# Patient Record
Sex: Male | Born: 1970
Health system: Southern US, Community
[De-identification: ages and names within clinical notes are randomized; demographics above are authoritative.]

## PROBLEM LIST (undated history)

## (undated) DIAGNOSIS — T753XXA Motion sickness, initial encounter: Secondary | ICD-10-CM

## (undated) DIAGNOSIS — T7840XA Allergy, unspecified, initial encounter: Secondary | ICD-10-CM

## (undated) DIAGNOSIS — Z972 Presence of dental prosthetic device (complete) (partial): Secondary | ICD-10-CM

## (undated) DIAGNOSIS — K635 Polyp of colon: Secondary | ICD-10-CM

## (undated) DIAGNOSIS — K219 Gastro-esophageal reflux disease without esophagitis: Secondary | ICD-10-CM

## (undated) DIAGNOSIS — N200 Calculus of kidney: Secondary | ICD-10-CM

## (undated) DIAGNOSIS — I10 Essential (primary) hypertension: Secondary | ICD-10-CM

## (undated) DIAGNOSIS — M1990 Unspecified osteoarthritis, unspecified site: Secondary | ICD-10-CM

## (undated) HISTORY — DX: Gastro-esophageal reflux disease without esophagitis: K21.9

## (undated) HISTORY — DX: Polyp of colon: K63.5

## (undated) HISTORY — DX: Allergy, unspecified, initial encounter: T78.40XA

---
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
## 2011-12-28 ENCOUNTER — Ambulatory Visit: Payer: Self-pay | Admitting: Podiatry

## 2012-01-29 ENCOUNTER — Emergency Department: Payer: Self-pay | Admitting: Emergency Medicine

## 2012-01-29 LAB — URINALYSIS, COMPLETE
Bilirubin,UR: NEGATIVE
Leukocyte Esterase: NEGATIVE
Nitrite: NEGATIVE
Ph: 8 (ref 4.5–8.0)
Protein: NEGATIVE
Specific Gravity: 1.018 (ref 1.003–1.030)
Squamous Epithelial: NONE SEEN

## 2012-09-25 ENCOUNTER — Emergency Department: Payer: Self-pay | Admitting: Emergency Medicine

## 2015-05-11 ENCOUNTER — Other Ambulatory Visit: Payer: Self-pay | Admitting: Family Medicine

## 2015-05-11 DIAGNOSIS — M898X8 Other specified disorders of bone, other site: Secondary | ICD-10-CM

## 2015-05-13 ENCOUNTER — Ambulatory Visit
Admission: RE | Admit: 2015-05-13 | Discharge: 2015-05-13 | Disposition: A | Discharge status: Home / Self Care | Payer: Federal, State, Local not specified - PPO | Source: Ambulatory Visit | Attending: Family Medicine | Admitting: Family Medicine

## 2015-05-13 DIAGNOSIS — M899 Disorder of bone, unspecified: Secondary | ICD-10-CM | POA: Insufficient documentation

## 2015-05-13 DIAGNOSIS — M898X8 Other specified disorders of bone, other site: Secondary | ICD-10-CM

## 2015-09-20 DIAGNOSIS — K080 Exfoliation of teeth due to systemic causes: Secondary | ICD-10-CM | POA: Diagnosis not present

## 2015-10-06 DIAGNOSIS — S0085XA Superficial foreign body of other part of head, initial encounter: Secondary | ICD-10-CM | POA: Diagnosis not present

## 2015-11-15 DIAGNOSIS — K080 Exfoliation of teeth due to systemic causes: Secondary | ICD-10-CM | POA: Diagnosis not present

## 2015-11-21 ENCOUNTER — Emergency Department
Admission: EM | Admit: 2015-11-21 | Discharge: 2015-11-21 | Disposition: A | Discharge status: Home / Self Care | Payer: Federal, State, Local not specified - PPO | Attending: Emergency Medicine | Admitting: Emergency Medicine

## 2015-11-21 ENCOUNTER — Emergency Department: Payer: Federal, State, Local not specified - PPO

## 2015-11-21 DIAGNOSIS — R109 Unspecified abdominal pain: Secondary | ICD-10-CM | POA: Diagnosis present

## 2015-11-21 DIAGNOSIS — R110 Nausea: Secondary | ICD-10-CM | POA: Diagnosis not present

## 2015-11-21 DIAGNOSIS — N200 Calculus of kidney: Secondary | ICD-10-CM | POA: Diagnosis not present

## 2015-11-21 HISTORY — DX: Calculus of kidney: N20.0

## 2015-11-21 LAB — URINALYSIS COMPLETE WITH MICROSCOPIC (ARMC ONLY)
BACTERIA UA: NONE SEEN
Bilirubin Urine: NEGATIVE
GLUCOSE, UA: NEGATIVE mg/dL
Ketones, ur: NEGATIVE mg/dL
LEUKOCYTES UA: NEGATIVE
NITRITE: NEGATIVE
PH: 5 (ref 5.0–8.0)
PROTEIN: NEGATIVE mg/dL
SPECIFIC GRAVITY, URINE: 1.02 (ref 1.005–1.030)
Squamous Epithelial / LPF: NONE SEEN

## 2015-11-21 LAB — CBC WITH DIFFERENTIAL/PLATELET
BASOS ABS: 0 10*3/uL (ref 0–0.1)
BASOS PCT: 0 %
Eosinophils Absolute: 0.1 10*3/uL (ref 0–0.7)
Eosinophils Relative: 2 %
HEMATOCRIT: 46.1 % (ref 40.0–52.0)
HEMOGLOBIN: 16.1 g/dL (ref 13.0–18.0)
LYMPHS PCT: 46 %
Lymphs Abs: 2.2 10*3/uL (ref 1.0–3.6)
MCH: 31.3 pg (ref 26.0–34.0)
MCHC: 35 g/dL (ref 32.0–36.0)
MCV: 89.4 fL (ref 80.0–100.0)
MONOS PCT: 11 %
Monocytes Absolute: 0.5 10*3/uL (ref 0.2–1.0)
NEUTROS ABS: 1.9 10*3/uL (ref 1.4–6.5)
NEUTROS PCT: 41 %
Platelets: 160 10*3/uL (ref 150–440)
RBC: 5.16 MIL/uL (ref 4.40–5.90)
RDW: 12.5 % (ref 11.5–14.5)
WBC: 4.8 10*3/uL (ref 3.8–10.6)

## 2015-11-21 LAB — BASIC METABOLIC PANEL
ANION GAP: 7 (ref 5–15)
BUN: 17 mg/dL (ref 6–20)
CHLORIDE: 107 mmol/L (ref 101–111)
CO2: 22 mmol/L (ref 22–32)
Calcium: 9 mg/dL (ref 8.9–10.3)
Creatinine, Ser: 1.01 mg/dL (ref 0.61–1.24)
GFR calc non Af Amer: >60 mL/min (ref >60)
Glucose, Bld: 111 mg/dL — ABNORMAL HIGH (ref 65–99)
POTASSIUM: 3.3 mmol/L — AB (ref 3.5–5.1)
Sodium: 136 mmol/L (ref 135–145)

## 2015-11-21 MED ORDER — HYDROMORPHONE HCL 1 MG/ML IJ SOLN
1.0000 mg | Freq: Once | INTRAMUSCULAR | Status: AC
  Administered 2015-11-21: 1 mg via INTRAVENOUS

## 2015-11-21 MED ORDER — ONDANSETRON HCL 4 MG/2ML IJ SOLN
INTRAMUSCULAR | Status: AC
  Filled 2015-11-21: qty 2

## 2015-11-21 MED ORDER — ONDANSETRON HCL 4 MG/2ML IJ SOLN
4.0000 mg | Freq: Once | INTRAMUSCULAR | Status: AC
  Administered 2015-11-21: 4 mg via INTRAVENOUS

## 2015-11-21 MED ORDER — IBUPROFEN 800 MG PO TABS: 800.0000 mg | ORAL_TABLET | Freq: Three times a day (TID) | ORAL | 0 refills | Status: DC | PRN

## 2015-11-21 MED ORDER — TAMSULOSIN HCL 0.4 MG PO CAPS: 0.4000 mg | ORAL_CAPSULE | Freq: Every day | ORAL | 0 refills | Status: DC

## 2015-11-21 MED ORDER — KETOROLAC TROMETHAMINE 30 MG/ML IJ SOLN
30.0000 mg | Freq: Once | INTRAMUSCULAR | Status: AC
  Administered 2015-11-21: 30 mg via INTRAVENOUS

## 2015-11-21 MED ORDER — SODIUM CHLORIDE 0.9 % IV BOLUS (SEPSIS)
1000.0000 mL | Freq: Once | INTRAVENOUS | Status: AC
  Administered 2015-11-21: 1000 mL via INTRAVENOUS

## 2015-11-21 MED ORDER — MORPHINE SULFATE (PF) 4 MG/ML IV SOLN
4.0000 mg | Freq: Once | INTRAVENOUS | Status: AC
  Administered 2015-11-21: 4 mg via INTRAVENOUS

## 2015-11-21 MED ORDER — OXYCODONE-ACETAMINOPHEN 5-325 MG PO TABS: 1.0000 | ORAL_TABLET | ORAL | 0 refills | Status: DC | PRN

## 2015-11-21 MED ORDER — ONDANSETRON HCL 4 MG PO TABS: 4.0000 mg | ORAL_TABLET | Freq: Three times a day (TID) | ORAL | 0 refills | Status: DC | PRN

## 2015-11-21 MED ORDER — KETOROLAC TROMETHAMINE 30 MG/ML IJ SOLN
INTRAMUSCULAR | Status: AC
  Administered 2015-11-21: 30 mg via INTRAVENOUS
  Filled 2015-11-21: qty 1

## 2015-11-21 MED ORDER — MORPHINE SULFATE (PF) 4 MG/ML IV SOLN
INTRAVENOUS | Status: AC
  Administered 2015-11-21: 4 mg via INTRAVENOUS
  Filled 2015-11-21: qty 1

## 2015-11-21 MED ORDER — HYDROMORPHONE HCL 1 MG/ML IJ SOLN
INTRAMUSCULAR | Status: AC
  Administered 2015-11-21: 1 mg via INTRAVENOUS
  Filled 2015-11-21: qty 1

## 2015-11-21 MED ORDER — SODIUM CHLORIDE 0.9 % IV SOLN: INTRAVENOUS | Status: DC

## 2015-11-21 NOTE — ED Provider Notes (Signed)
[facility] Emergency Department Provider Note    ____________________________________________   I have reviewed the triage vital signs and the nursing notes.   HISTORY  Chief Complaint Flank Pain   History limited by: Not Limited   HPI Bryan Cortez is a 45 y.o. male with history of a kidney stone a few years ago who presents for right flank pain. The pain is severe. It started suddenly this morning. It has been persistent since then. It has been accompanied by nausea and some vomiting. Patient denies any fevers. Denies any dysuria. Denies any change in defecation. States it somewhat reminds him of the pain he said with a kidney stone in the past. He denies requiring lithotripsy in the past.     Past Medical History:  Diagnosis Date  . Kidney stone     There are no active problems to display for this patient.   Past Surgical History:  Procedure Laterality Date  . TESTICLE SURGERY Bilateral    undistended      Allergies Review of patient's allergies indicates no known allergies.  No family history on file.  Social History Social History  Substance Use Topics  . Smoking status: Never Smoker  . Smokeless tobacco: Never Used  . Alcohol use No    Review of Systems  Constitutional: Negative for fever. Cardiovascular: Negative for chest pain. Respiratory: Negative for shortness of breath. Gastrointestinal:Positive for right flank pain and nausea Genitourinary: Negative for dysuria. Musculoskeletal: Negative for back pain. Skin: Negative for rash. Neurological: Negative for headaches, focal weakness or numbness.  10-point ROS otherwise negative.  ____________________________________________   PHYSICAL EXAM:  VITAL SIGNS: ED Triage Vitals  Enc Vitals Group     BP 177/112     Pulse 62     Resp 17     Temp 97.7     Temp src      SpO2 94%   Constitutional: Alert and oriented. Appears uncomfortable Eyes: Conjunctivae are  normal. PERRL. Normal extraocular movements. ENT   Head: Normocephalic and atraumatic.   Nose: No congestion/rhinnorhea.   Mouth/Throat: Mucous membranes are moist.   Neck: No stridor. Hematological/Lymphatic/Immunilogical: No cervical lymphadenopathy. Cardiovascular: Normal rate, regular rhythm.  No murmurs, rubs, or gallops. Respiratory: Normal respiratory effort without tachypnea nor retractions. Breath sounds are clear and equal bilaterally. No wheezes/rales/rhonchi. Gastrointestinal: Soft and nontender. No distention. There is no CVA tenderness. Genitourinary: Deferred Musculoskeletal: Normal range of motion in all extremities. No joint effusions.  No lower extremity tenderness nor edema. Neurologic:  Normal speech and language. No gross focal neurologic deficits are appreciated.  Skin:  Skin is warm, dry and intact. No rash noted. Psychiatric: Mood and affect are normal. Speech and behavior are normal. Patient exhibits appropriate insight and judgment.  ____________________________________________    LABS (pertinent positives/negatives)  Labs Reviewed  BASIC METABOLIC PANEL - Abnormal; Notable for the following:       Result Value   Potassium 3.3 (*)    Glucose, Bld 111 (*)    All other components within normal limits  URINALYSIS COMPLETEWITH MICROSCOPIC ([facility] ONLY) - Abnormal; Notable for the following:    Color, Urine YELLOW (*)    APPearance CLEAR (*)    Hgb urine dipstick 3+ (*)    All other components within normal limits  CBC WITH DIFFERENTIAL/PLATELET     ____________________________________________   EKG  None  ____________________________________________    RADIOLOGY  US renal IMPRESSION: 1. 4.3 mm calculus right upper renal pole. 2. 1.7 cm  simple cyst left upper renal pole.  ____________________________________________   PROCEDURES  Procedures  ____________________________________________   INITIAL IMPRESSION / ASSESSMENT  AND PLAN / ED COURSE  Pertinent labs & imaging results that were available during my care of the patient were reviewed by me and considered in my medical decision making (see chart for details).  Patient presents to the emergency department today because of concerns for right flank pain. Patient does have a history of kidney stone. Clinical exam is consistent with kidney stone. Will check blood work, urine and US OF THE KIDNEYS.  Clinical Course   [name] without any signs of hydronephrosis. UA without WBCs. Blood work with leukocytosis or elevation of creatinine. Think likely patient suffering from kidney stone without complication of infection or obstruction. Will discharge with pain medication, nausea medication and flomax. ____________________________________________   FINAL CLINICAL IMPRESSION(S) / ED DIAGNOSES  Final diagnoses:  Kidney stone     Note: This dictation was prepared with Dragon dictation. Any transcriptional errors that result from this process are unintentional    Nance Pear, MD 11/21/15 1154

## 2015-11-21 NOTE — ED Triage Notes (Signed)
Pt c/o right flank pain since 645am with nausea, hx of kidney stones.

## 2015-11-21 NOTE — ED Notes (Signed)
AAOx3.  Skin warm and dry.  NAD 

## 2015-11-21 NOTE — Discharge Instructions (Signed)
Please seek medical attention for any high fevers, chest pain, shortness of breath, change in behavior, persistent vomiting, bloody stool or any other new or concerning symptoms.  

## 2015-11-24 ENCOUNTER — Ambulatory Visit
Admission: RE | Admit: 2015-11-24 | Discharge: 2015-11-24 | Disposition: A | Discharge status: Home / Self Care | Payer: Federal, State, Local not specified - PPO | Source: Ambulatory Visit | Attending: Urology | Admitting: Urology

## 2015-11-24 ENCOUNTER — Encounter: Payer: Self-pay | Admitting: Urology

## 2015-11-24 ENCOUNTER — Ambulatory Visit (INDEPENDENT_AMBULATORY_CARE_PROVIDER_SITE_OTHER): Payer: Federal, State, Local not specified - PPO | Admitting: Urology

## 2015-11-24 VITALS — BP 175/112 | HR 61 | Temp 98.1°F | Ht 72.0 in | Wt 238.8 lb

## 2015-11-24 DIAGNOSIS — N132 Hydronephrosis with renal and ureteral calculous obstruction: Secondary | ICD-10-CM | POA: Diagnosis not present

## 2015-11-24 DIAGNOSIS — N400 Enlarged prostate without lower urinary tract symptoms: Secondary | ICD-10-CM | POA: Diagnosis not present

## 2015-11-24 DIAGNOSIS — M4316 Spondylolisthesis, lumbar region: Secondary | ICD-10-CM | POA: Diagnosis not present

## 2015-11-24 DIAGNOSIS — K5730 Diverticulosis of large intestine without perforation or abscess without bleeding: Secondary | ICD-10-CM | POA: Insufficient documentation

## 2015-11-24 DIAGNOSIS — N200 Calculus of kidney: Secondary | ICD-10-CM | POA: Diagnosis not present

## 2015-11-24 DIAGNOSIS — J309 Allergic rhinitis, unspecified: Secondary | ICD-10-CM | POA: Insufficient documentation

## 2015-11-24 DIAGNOSIS — R51 Headache: Secondary | ICD-10-CM

## 2015-11-24 MED ORDER — KETOROLAC TROMETHAMINE 60 MG/2ML IM SOLN
30.0000 mg | Freq: Once | INTRAMUSCULAR | Status: AC
  Administered 2015-11-24: 30 mg via INTRAMUSCULAR

## 2015-11-24 NOTE — Progress Notes (Signed)
11/24/2015 3:36 PM   Bryan Cortez [birth year] [id number]  Referring provider: No referring provider defined for this encounter.  Chief Complaint  Patient presents with  . New Patient (Initial Visit)    nephrolithiasis    HPI: The patient is a 45 year old male who presents for follow-up of a 4 mm right upper pole nonobstructing renal calculus. He originally presented in the ED a few days ago with severe right flank pain.  He has continued to have flank pain since that time requiring Percocet. He also has nausea and vomiting. He is also been intermittently taking ibuprofen. He continues to have 8-9 out of 10 pain.   In review of the ultrasound though however there is no sign of hydronephrosis.   PMH: Past Medical History:  Diagnosis Date  . Kidney stone     Surgical History: Past Surgical History:  Procedure Laterality Date  . TESTICLE SURGERY Bilateral    undistended    Home Medications:    Medication List       Accurate as of 11/24/15  3:36 PM. Always use your most recent med list.          ibuprofen 800 MG tablet Commonly known as:  ADVIL,MOTRIN Take 1 tablet (800 mg total) by mouth every 8 (eight) hours as needed.   ondansetron 4 MG tablet Commonly known as:  ZOFRAN Take 1 tablet (4 mg total) by mouth every 8 (eight) hours as needed for nausea or vomiting.   oxyCODONE-acetaminophen 5-325 MG tablet Commonly known as:  ROXICET Take 1 tablet by mouth every 4 (four) hours as needed for severe pain.   tamsulosin 0.4 MG Caps capsule Commonly known as:  FLOMAX Take 1 capsule (0.4 mg total) by mouth daily.       Allergies: No Known Allergies  Family History: No family history on file.  Social History:  reports that he has never smoked. He has never used smokeless tobacco. He reports that he does not drink alcohol. His drug history is not on file.  ROS: UROLOGY Frequent Urination?: No Hard to postpone urination?: No Burning/pain with urination?:  No Get up at night to urinate?: No Leakage of urine?: No Urine stream starts and stops?: No Trouble starting stream?: No Do you have to strain to urinate?: No Blood in urine?: No Urinary tract infection?: No Sexually transmitted disease?: No Injury to kidneys or bladder?: No Painful intercourse?: No Weak stream?: No Erection problems?: No Penile pain?: No  Gastrointestinal Nausea?: Yes Vomiting?: No Indigestion/heartburn?: No Diarrhea?: No Constipation?: Yes  Constitutional Fever: No Night sweats?: No Weight loss?: No Fatigue?: No  Skin Skin rash/lesions?: No Itching?: No  Eyes Blurred vision?: No Double vision?: No  Ears/Nose/Throat Sore throat?: No Sinus problems?: No  Hematologic/Lymphatic Swollen glands?: No Easy bruising?: No  Cardiovascular Leg swelling?: No Chest pain?: No  Respiratory Cough?: No Shortness of breath?: No  Endocrine Excessive thirst?: No  Musculoskeletal Back pain?: Yes Joint pain?: No  Neurological Headaches?: No Dizziness?: No  Psychologic Depression?: No Anxiety?: No  Physical Exam: BP (!) 175/112 (BP Location: Left Arm, Patient Position: Sitting, Cuff Size: Large)   Pulse 61   Temp 98.1 F (36.7 C) (Oral)   Ht 6' (1.829 m)   Wt 238 lb 12.8 oz (108.3 kg)   BMI 32.39 kg/m   Constitutional:  Alert and oriented, No acute distress. HEENT: [place] AT, moist mucus membranes.  Trachea midline, no masses. Cardiovascular: No clubbing, cyanosis, or edema. Respiratory: Normal respiratory effort, no increased  work of breathing. GI: Abdomen is soft, nontender, nondistended, no abdominal masses GU: Right CVA tenderness Skin: No rashes, bruises or suspicious lesions. Lymph: No cervical or inguinal adenopathy. Neurologic: Grossly intact, no focal deficits, moving all 4 extremities. Psychiatric: Normal mood and affect.  Laboratory Data: Lab Results  Component Value Date   WBC 4.8 11/21/2015   HGB 16.1 11/21/2015   HCT  46.1 11/21/2015   MCV 89.4 11/21/2015   PLT 160 11/21/2015    Lab Results  Component Value Date   CREATININE 1.01 11/21/2015    No results found for: PSA  No results found for: TESTOSTERONE  No results found for: HGBA1C  Urinalysis    Component Value Date/Time   COLORURINE YELLOW (A) 11/21/2015 0748   APPEARANCEUR CLEAR (A) 11/21/2015 0748   APPEARANCEUR Cloudy 01/29/2012 0652   LABSPEC 1.020 11/21/2015 0748   LABSPEC 1.018 01/29/2012 0652   PHURINE 5.0 11/21/2015 0748   GLUCOSEU NEGATIVE 11/21/2015 0748   GLUCOSEU Negative 01/29/2012 0652   HGBUR 3+ (A) 11/21/2015 0748   BILIRUBINUR NEGATIVE 11/21/2015 0748   BILIRUBINUR Negative 01/29/2012 0652   KETONESUR NEGATIVE 11/21/2015 0748   PROTEINUR NEGATIVE 11/21/2015 0748   NITRITE NEGATIVE 11/21/2015 0748   LEUKOCYTESUR NEGATIVE 11/21/2015 0748   LEUKOCYTESUR Negative 01/29/2012 0652    Pertinent Imaging: CLINICAL DATA:  Right flank pain. EXAM: RENAL / URINARY TRACT ULTRASOUND COMPLETE COMPARISON:  CT 01/29/2012. FINDINGS: Right Kidney: Length: 12.6 cm. Echogenicity within normal limits. No mass or hydronephrosis visualized. 4.3 mm calculus right upper renal pole. Left Kidney: Length: 11.8 cm. Echogenicity within normal limits. No mass or hydronephrosis visualized. 1.7 x 1.6 x 1.3 cm cyst. Bladder: Appears normal for degree of bladder distention. IMPRESSION: 1.  4.3 mm calculus right upper renal pole. 2.  1.7 cm simple cyst left upper renal pole.  Assessment & Plan:   1. Right nonobstructing 4 mm renal calculus 2. Left benign renal cyst I will oh suspicion for the patient's symptoms being related to obstructive uropathy as he has no hydronephrosis on his renal ultrasound. We will obtain a CT scan stone protocol stat to rule out a distal stone however. He is also given a shot of Toradol to help with pain relief.  Return for CT results.  Nickie Retort, MD  [facility]  [address], [place], [place] [address] [phone]   Addendum: CT shows previously nonobstructing right 4 mm stone at right UVJ. Patient informed. Will obtain KUB, renal u/s in 2 weeks unless patient can bring stone in prior.

## 2015-12-01 ENCOUNTER — Telehealth: Payer: Self-pay | Admitting: Urology

## 2015-12-01 ENCOUNTER — Ambulatory Visit: Payer: Self-pay | Admitting: Urology

## 2015-12-01 DIAGNOSIS — N200 Calculus of kidney: Secondary | ICD-10-CM

## 2015-12-01 NOTE — Telephone Encounter (Signed)
Orders placed.

## 2015-12-01 NOTE — Telephone Encounter (Signed)
-----   Message from Lestine Box, LPN sent at [id number]  8:47 AM EDT -----   ----- Message ----- From: Nickie Retort, MD Sent: 11/24/2015   5:20 PM To: Lestine Box, LPN  Spoke with patient already this evening re: CT results. He needs his appt next week canceled. He needs to instead follow up in 2 weeks with renal u/s and kub prior. Thanks.

## 2015-12-01 NOTE — Telephone Encounter (Signed)
I need an order for the kub and [name]   Thanks, Sharyn Lull

## 2015-12-06 ENCOUNTER — Telehealth: Payer: Self-pay | Admitting: Radiology

## 2015-12-06 NOTE — Telephone Encounter (Signed)
Pt states he passed the kidney stone. Advised pt per Dr Alyson Ingles to cancel RUS but keep appt with Dr Pilar Jarvis on 12/22/15 with KUB prior & bring stone to office for analysis. Pt voices understanding.

## 2015-12-13 ENCOUNTER — Ambulatory Visit: Payer: Federal, State, Local not specified - PPO

## 2015-12-21 DIAGNOSIS — Z0000 Encounter for general adult medical examination without abnormal findings: Secondary | ICD-10-CM | POA: Diagnosis not present

## 2015-12-21 DIAGNOSIS — Z125 Encounter for screening for malignant neoplasm of prostate: Secondary | ICD-10-CM | POA: Diagnosis not present

## 2015-12-21 DIAGNOSIS — Z833 Family history of diabetes mellitus: Secondary | ICD-10-CM | POA: Diagnosis not present

## 2015-12-22 ENCOUNTER — Ambulatory Visit
Admission: RE | Admit: 2015-12-22 | Discharge: 2015-12-22 | Disposition: A | Discharge status: Home / Self Care | Payer: Federal, State, Local not specified - PPO | Source: Ambulatory Visit | Attending: Urology | Admitting: Urology

## 2015-12-22 ENCOUNTER — Encounter: Payer: Self-pay | Admitting: Urology

## 2015-12-22 ENCOUNTER — Ambulatory Visit (INDEPENDENT_AMBULATORY_CARE_PROVIDER_SITE_OTHER): Payer: Federal, State, Local not specified - PPO | Admitting: Urology

## 2015-12-22 VITALS — BP 150/96 | HR 56 | Ht 71.0 in | Wt 233.0 lb

## 2015-12-22 DIAGNOSIS — N200 Calculus of kidney: Secondary | ICD-10-CM | POA: Diagnosis not present

## 2015-12-22 DIAGNOSIS — N132 Hydronephrosis with renal and ureteral calculous obstruction: Secondary | ICD-10-CM | POA: Diagnosis not present

## 2015-12-22 NOTE — Progress Notes (Signed)
12/22/2015 8:51 AM   Bryan Cortez 1970-11-28 [id number]  Referring provider: No referring provider defined for this encounter.  Chief Complaint  Patient presents with  . Nephrolithiasis    KUB result     HPI: The patient is a 45 year old gentleman who is on medical expulsive therapy for 4 mm UVJ stone on the right side. He has since passed the stone. His pain is resolved. He has no nausea or vomiting. He is voiding well. This is his third stone in his life.  KUB shows no evidence of disease.      PMH: Past Medical History:  Diagnosis Date  . Kidney stone     Surgical History: Past Surgical History:  Procedure Laterality Date  . TESTICLE SURGERY Bilateral    undistended    Home Medications:    Medication List    as of 12/22/2015  8:51 AM   You have not been prescribed any medications.     Allergies: No Known Allergies  Family History: Family History  Problem Relation Age of Onset  . Bladder Cancer Father   . Kidney Stones Sister   . Kidney cancer Neg Hx   . Prostate cancer Neg Hx     Social History:  reports that he has never smoked. He has never used smokeless tobacco. He reports that he does not drink alcohol. His drug history is not on file.  ROS: UROLOGY Frequent Urination?: No Hard to postpone urination?: No Burning/pain with urination?: No Get up at night to urinate?: No Leakage of urine?: No Urine stream starts and stops?: No Trouble starting stream?: No Do you have to strain to urinate?: No Blood in urine?: No Urinary tract infection?: No Sexually transmitted disease?: No Injury to kidneys or bladder?: No Painful intercourse?: No Weak stream?: No Erection problems?: No Penile pain?: No  Gastrointestinal Nausea?: No Vomiting?: No Indigestion/heartburn?: No Diarrhea?: No Constipation?: No  Constitutional Fever: No Night sweats?: No Weight loss?: No Fatigue?: No  Skin Skin rash/lesions?: No Itching?: No  Eyes Blurred  vision?: No Double vision?: No  Ears/Nose/Throat Sore throat?: No Sinus problems?: No  Hematologic/Lymphatic Swollen glands?: No Easy bruising?: No  Cardiovascular Leg swelling?: No Chest pain?: No  Respiratory Cough?: No Shortness of breath?: No  Endocrine Excessive thirst?: No  Musculoskeletal Back pain?: No Joint pain?: No  Neurological Headaches?: No Dizziness?: No  Psychologic Depression?: No Anxiety?: No  Physical Exam: BP (!) 150/96   Pulse (!) 56   Ht 5\' 11"  (1.803 m)   Wt 233 lb (105.7 kg)   BMI 32.50 kg/m   Constitutional:  Alert and oriented, No acute distress. HEENT: [place] AT, moist mucus membranes.  Trachea midline, no masses. Cardiovascular: No clubbing, cyanosis, or edema. Respiratory: Normal respiratory effort, no increased work of breathing. GI: Abdomen is soft, nontender, nondistended, no abdominal masses GU: No CVA tenderness.  Skin: No rashes, bruises or suspicious lesions. Lymph: No cervical or inguinal adenopathy. Neurologic: Grossly intact, no focal deficits, moving all 4 extremities. Psychiatric: Normal mood and affect.  Laboratory Data: Lab Results  Component Value Date   WBC 4.8 11/21/2015   HGB 16.1 11/21/2015   HCT 46.1 11/21/2015   MCV 89.4 11/21/2015   PLT 160 11/21/2015    Lab Results  Component Value Date   CREATININE 1.01 11/21/2015    No results found for: PSA  No results found for: TESTOSTERONE  No results found for: HGBA1C  Urinalysis    Component Value Date/Time   COLORURINE YELLOW (  A) 11/21/2015 0748   APPEARANCEUR CLEAR (A) 11/21/2015 0748   APPEARANCEUR Cloudy 01/29/2012 0652   LABSPEC 1.020 11/21/2015 0748   LABSPEC 1.018 01/29/2012 0652   PHURINE 5.0 11/21/2015 0748   GLUCOSEU NEGATIVE 11/21/2015 0748   GLUCOSEU Negative 01/29/2012 0652   HGBUR 3+ (A) 11/21/2015 0748   BILIRUBINUR NEGATIVE 11/21/2015 0748   BILIRUBINUR Negative 01/29/2012 0652   KETONESUR NEGATIVE 11/21/2015 0748   PROTEINUR  NEGATIVE 11/21/2015 0748   NITRITE NEGATIVE 11/21/2015 0748   LEUKOCYTESUR NEGATIVE 11/21/2015 0748   LEUKOCYTESUR Negative 01/29/2012 0652    Pertinent Imaging: CLINICAL DATA:  45 year old male presenting for follow-up of a 4 mm right upper pole nonobstructing renal calculus. Severe right flank pain several days ago. Continued flank pain, nausea and vomiting. EXAM: CT ABDOMEN AND PELVIS WITHOUT CONTRAST TECHNIQUE: Multidetector CT imaging of the abdomen and pelvis was performed following the standard protocol without IV contrast. COMPARISON:  CT abdomen dated 01/29/2012 and chest CT dated 05/13/2015. FINDINGS: Lower chest:  No acute findings. Hepatobiliary: Liver and gallbladder appear normal. Pancreas: Pancreas appears normal. Spleen: Within normal limits in size. Adrenals/Urinary Tract: Obstructing 4 mm stone at the right UVJ causing moderate hydronephrosis and perinephric edema. Additional punctate stone within the lower pole of the right renal pelvis. Punctate nonobstructing stone within the left renal pelvis. Probable small left renal cyst, difficult to characterize without IV contrast, grossly stable compared to previous exam. Stomach/Bowel: Mild diverticulosis of the sigmoid colon without evidence of acute diverticulitis. Bowel is normal in caliber throughout. Appendix is normal. Vascular/Lymphatic: No pathologically enlarged lymph nodes. No evidence of abdominal aortic aneurysm. Reproductive: Prostate gland mildly prominent in size, with central and peripheral calcifications, causing slight mass effect on the bladder base. Other: No abscess collections seen. No free intraperitoneal air seen. Musculoskeletal: Mild degenerative change within the lumbar spine. Chronic pars interarticularis defects at L4-5 with associated mild (grade 1) spondylolisthesis. No acute or suspicious osseous finding. Superficial soft tissues are unremarkable. IMPRESSION: 1. Obstructing 4 mm  stone at the right UVJ causing moderate hydronephrosis. 2. Bilateral punctate renal stones. 3. Sigmoid colon diverticulosis without evidence of acute diverticulitis. 4. Prostate gland at least mildly enlarged, with central and peripheral calcifications, causing slight mass effect on the bladder base. 5. Chronic pars interarticularis defects at L4-5 with associated mild (grade 1) spondylolisthesis. These results will be called to the ordering clinician or representative by the Radiologist Assistant, and communication documented in the PACS or zVision Dashboard. Electronically Signed  CLINICAL DATA:  Recent right-sided ureteral calculus with hydronephrosis  EXAM: ABDOMEN - 1 VIEW  COMPARISON:  CT abdomen and pelvis November 24, 2015  FINDINGS: There are multiple prostatic calculi. No other abnormal calcifications are evident currently. There is moderate stool throughout the colon. The bowel gas pattern is normal. No bowel obstruction or free air evident.  IMPRESSION: Bowel gas pattern within normal limits. Multiple prostatic calculi present. No other calcifications evident.  Assessment & Plan:    1. Right UVJ stone The patient has passed the stone. He has no significant stone burden at this time. We will send the stone for analysis. I went over with him weighs to prevent stones including one over the ABC's his stone formation booklet. We did briefly discuss a 24-hour urine collection. The patient is not interested in at this time. He will follow with [name] when necessary. We will send him his stone analysis results when they're available.  Return if symptoms worsen or fail to improve.  Nickie Retort,  MD  [facility] [address] [place], [place] [address] [phone]

## 2016-01-09 ENCOUNTER — Other Ambulatory Visit: Payer: Self-pay | Admitting: Urology

## 2016-01-27 ENCOUNTER — Telehealth: Payer: Self-pay

## 2016-01-27 NOTE — Telephone Encounter (Signed)
Please send patient copy of report. Please let him know that his stone his mostly calcium oxalate based which is the most common. The Stone book that I gave him will help him prevent these in the future. - Dr. Pilar Jarvis  Letter sent.

## 2016-05-22 DIAGNOSIS — K080 Exfoliation of teeth due to systemic causes: Secondary | ICD-10-CM | POA: Diagnosis not present

## 2016-10-02 DIAGNOSIS — Z872 Personal history of diseases of the skin and subcutaneous tissue: Secondary | ICD-10-CM | POA: Diagnosis not present

## 2016-10-02 DIAGNOSIS — L821 Other seborrheic keratosis: Secondary | ICD-10-CM | POA: Diagnosis not present

## 2016-10-02 DIAGNOSIS — Z85828 Personal history of other malignant neoplasm of skin: Secondary | ICD-10-CM | POA: Diagnosis not present

## 2016-10-02 DIAGNOSIS — D485 Neoplasm of uncertain behavior of skin: Secondary | ICD-10-CM | POA: Diagnosis not present

## 2016-10-02 DIAGNOSIS — Z86018 Personal history of other benign neoplasm: Secondary | ICD-10-CM | POA: Diagnosis not present

## 2016-10-23 IMAGING — CT CT RENAL STONE PROTOCOL
3 of 5 series · 9 of 46 positions shown, 16 images · non-contrast
Comparison: CT abdomen dated 01/29/2012 and chest CT dated
05/13/2015.

CLINICAL DATA: 45-year-old male presenting for follow-up of a 4 mm
right upper pole nonobstructing renal calculus. Severe right flank
pain several days ago. Continued flank pain, nausea and vomiting.

EXAM:
CT ABDOMEN AND PELVIS WITHOUT CONTRAST
TECHNIQUE: Multidetector CT imaging of the abdomen and pelvis was performed
following the standard protocol without IV contrast.

[Series 6: lung · axial · 0.80mm/px · z∈[-210,-110]mm · 5 of 32 slices shown, 10 images]
[im 6/32  soft-tissue]
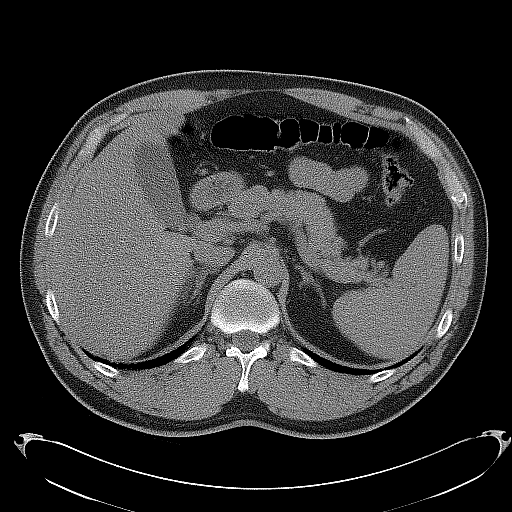
[im 6/32  bone]
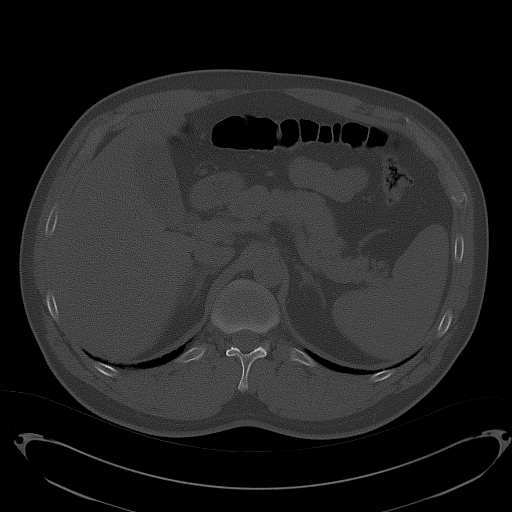
[im 11/32  soft-tissue]
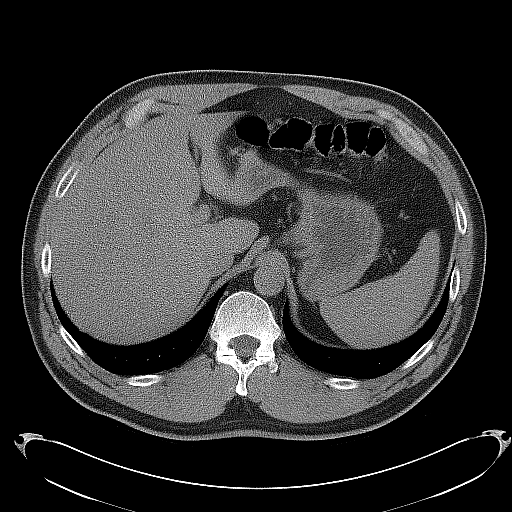
[im 11/32  lung]
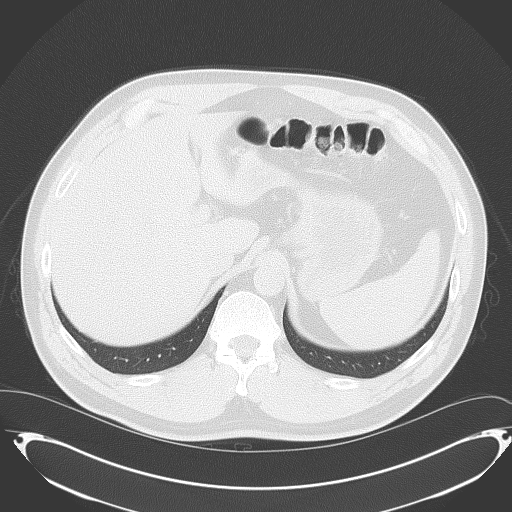
[im 16/32  soft-tissue]
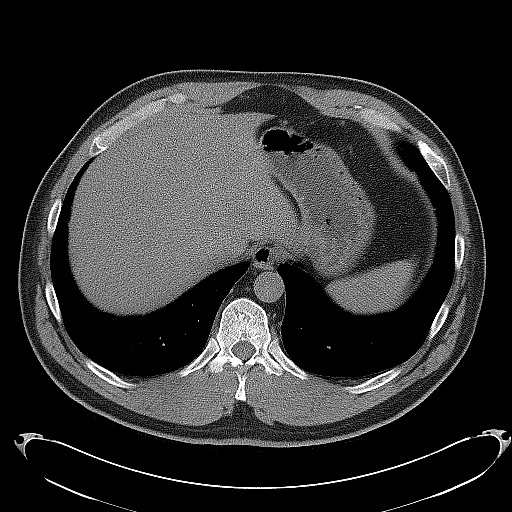
[im 16/32  lung]
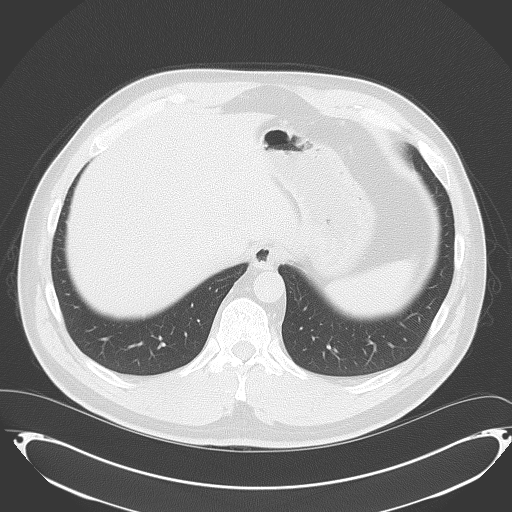
[im 21/32  soft-tissue]
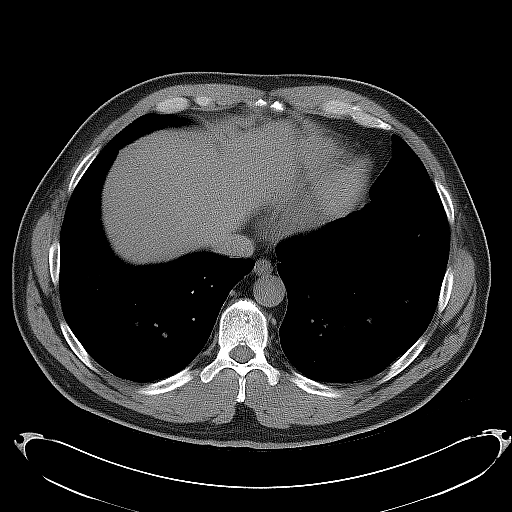
[im 21/32  lung]
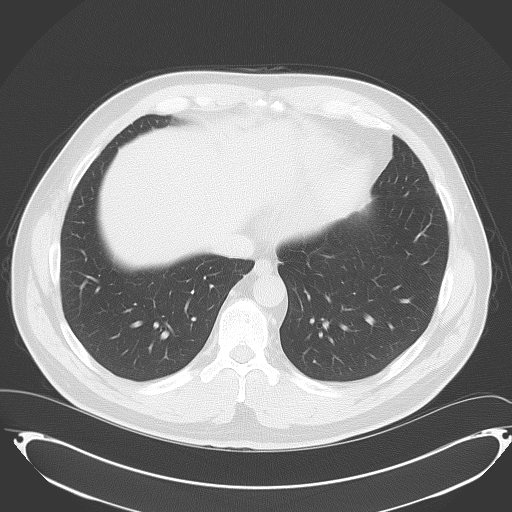
[im 26/32  soft-tissue]
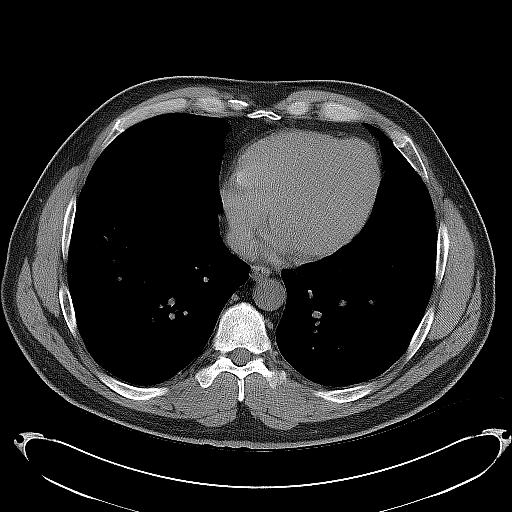
[im 26/32  lung]
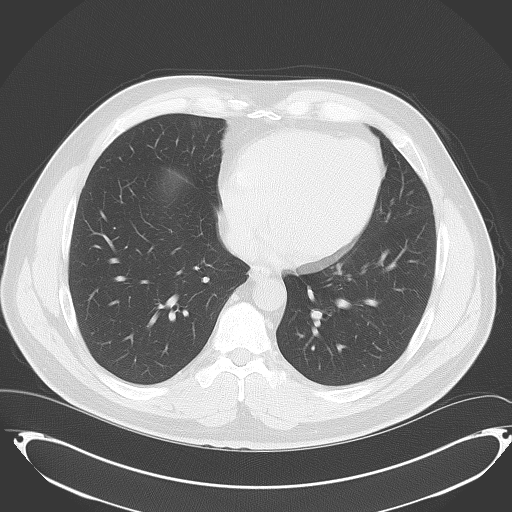

[Series 7: coronal · coronal · 0.82mm/px · 3 of 151 slices shown, 4 images]
[im 51/151  soft-tissue]
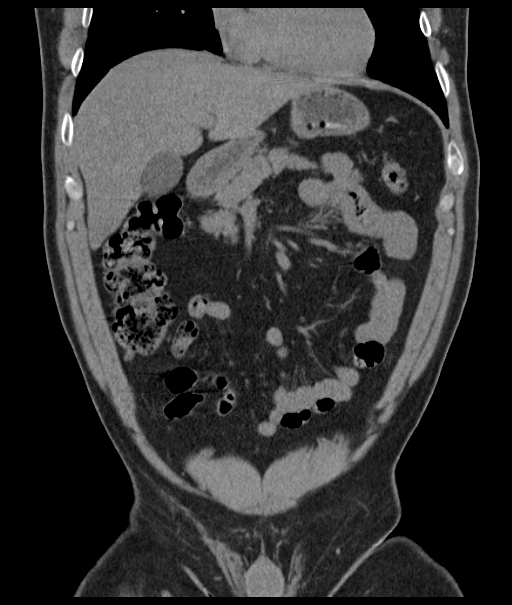
[im 67/151  soft-tissue]
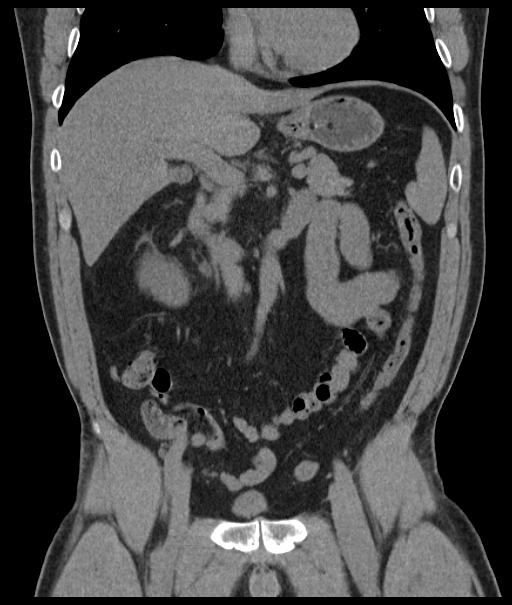
[im 67/151  bone]
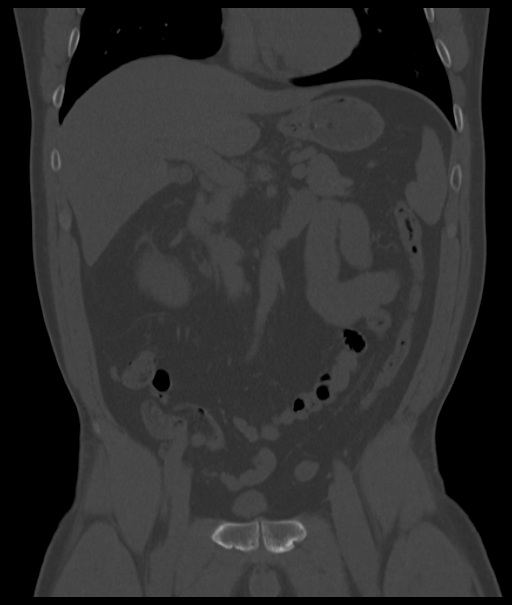
[im 84/151  soft-tissue]
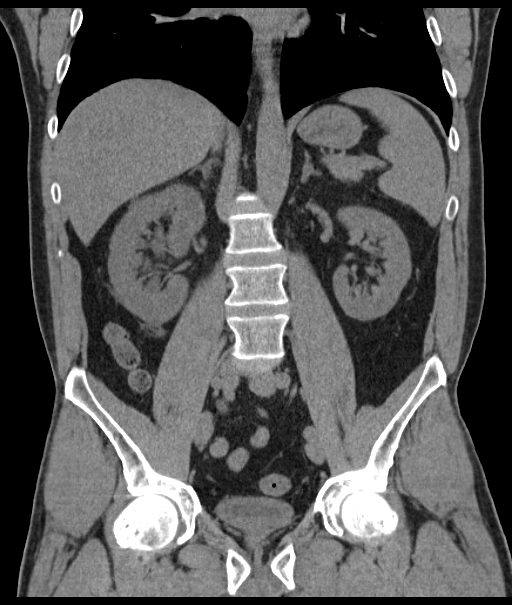

[Series 8: sagittal · sagittal · 0.67mm/px · 1 of 188 slices shown, 2 images]
[im 63/188  soft-tissue]
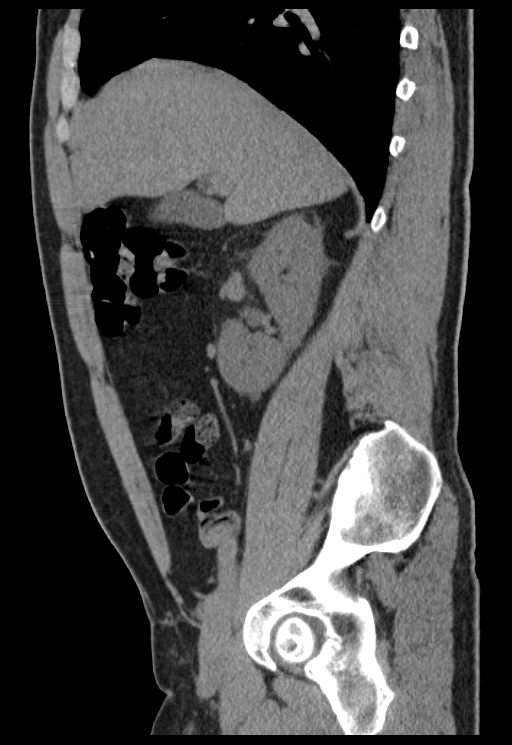
[im 63/188  bone]
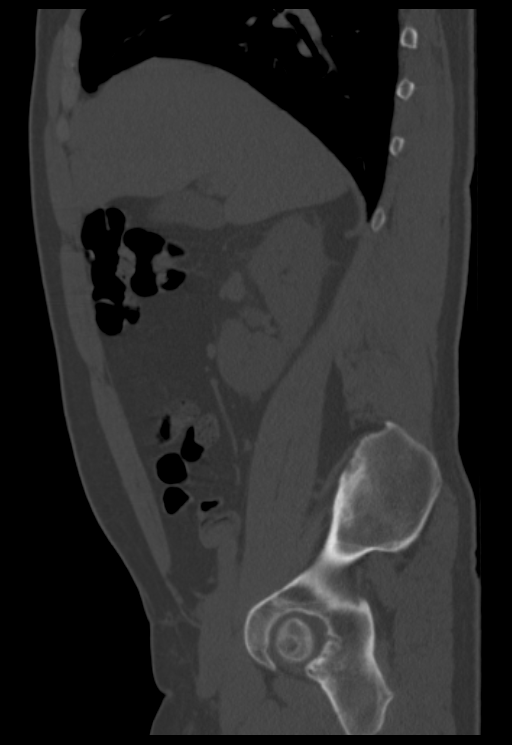

[9 of 46 positions shown; findings below may reference images not displayed]

FINDINGS: Lower chest:  No acute findings.

Hepatobiliary: Liver and gallbladder appear normal.

Pancreas: Pancreas appears normal.

Spleen: Within normal limits in size.

Adrenals/Urinary Tract: Obstructing 4 mm stone at the right UVJ
causing moderate hydronephrosis and perinephric edema.

Additional punctate stone within the lower pole of the right renal
pelvis. Punctate nonobstructing stone within the left renal pelvis.
Probable small left renal cyst, difficult to characterize without IV
contrast, grossly stable compared to previous exam.

Stomach/Bowel: Mild diverticulosis of the sigmoid colon without
evidence of acute diverticulitis. Bowel is normal in caliber
throughout. Appendix is normal.

Vascular/Lymphatic: No pathologically enlarged lymph nodes. No
evidence of abdominal aortic aneurysm.

Reproductive: Prostate gland mildly prominent in size, with central
and peripheral calcifications, causing slight mass effect on the
bladder base.

Other: No abscess collections seen. No free intraperitoneal air
seen.

Musculoskeletal: Mild degenerative change within the lumbar spine.
Chronic pars interarticularis defects at L4-5 with associated mild
(grade 1) spondylolisthesis. No acute or suspicious osseous finding.

Superficial soft tissues are unremarkable.
IMPRESSION: 1. Obstructing 4 mm stone at the right UVJ causing moderate
hydronephrosis.
2. Bilateral punctate renal stones.
3. Sigmoid colon diverticulosis without evidence of acute
diverticulitis.
4. Prostate gland at least mildly enlarged, with central and
peripheral calcifications, causing slight mass effect on the bladder
base.
5. Chronic pars interarticularis defects at L4-5 with associated
mild (grade 1) spondylolisthesis.

These results will be called to the ordering clinician or
representative by the Radiologist Assistant, and communication
documented in the PACS or zVision Dashboard.

## 2016-11-13 DIAGNOSIS — K080 Exfoliation of teeth due to systemic causes: Secondary | ICD-10-CM | POA: Diagnosis not present

## 2016-11-20 IMAGING — CR DG ABDOMEN 1V
1 series · 1 of 1 positions shown · non-contrast
Comparison: CT abdomen and pelvis November 24, 2015

CLINICAL DATA: Recent right-sided ureteral calculus with
hydronephrosis

EXAM:
ABDOMEN - 1 VIEW

[dg abd 1 view]
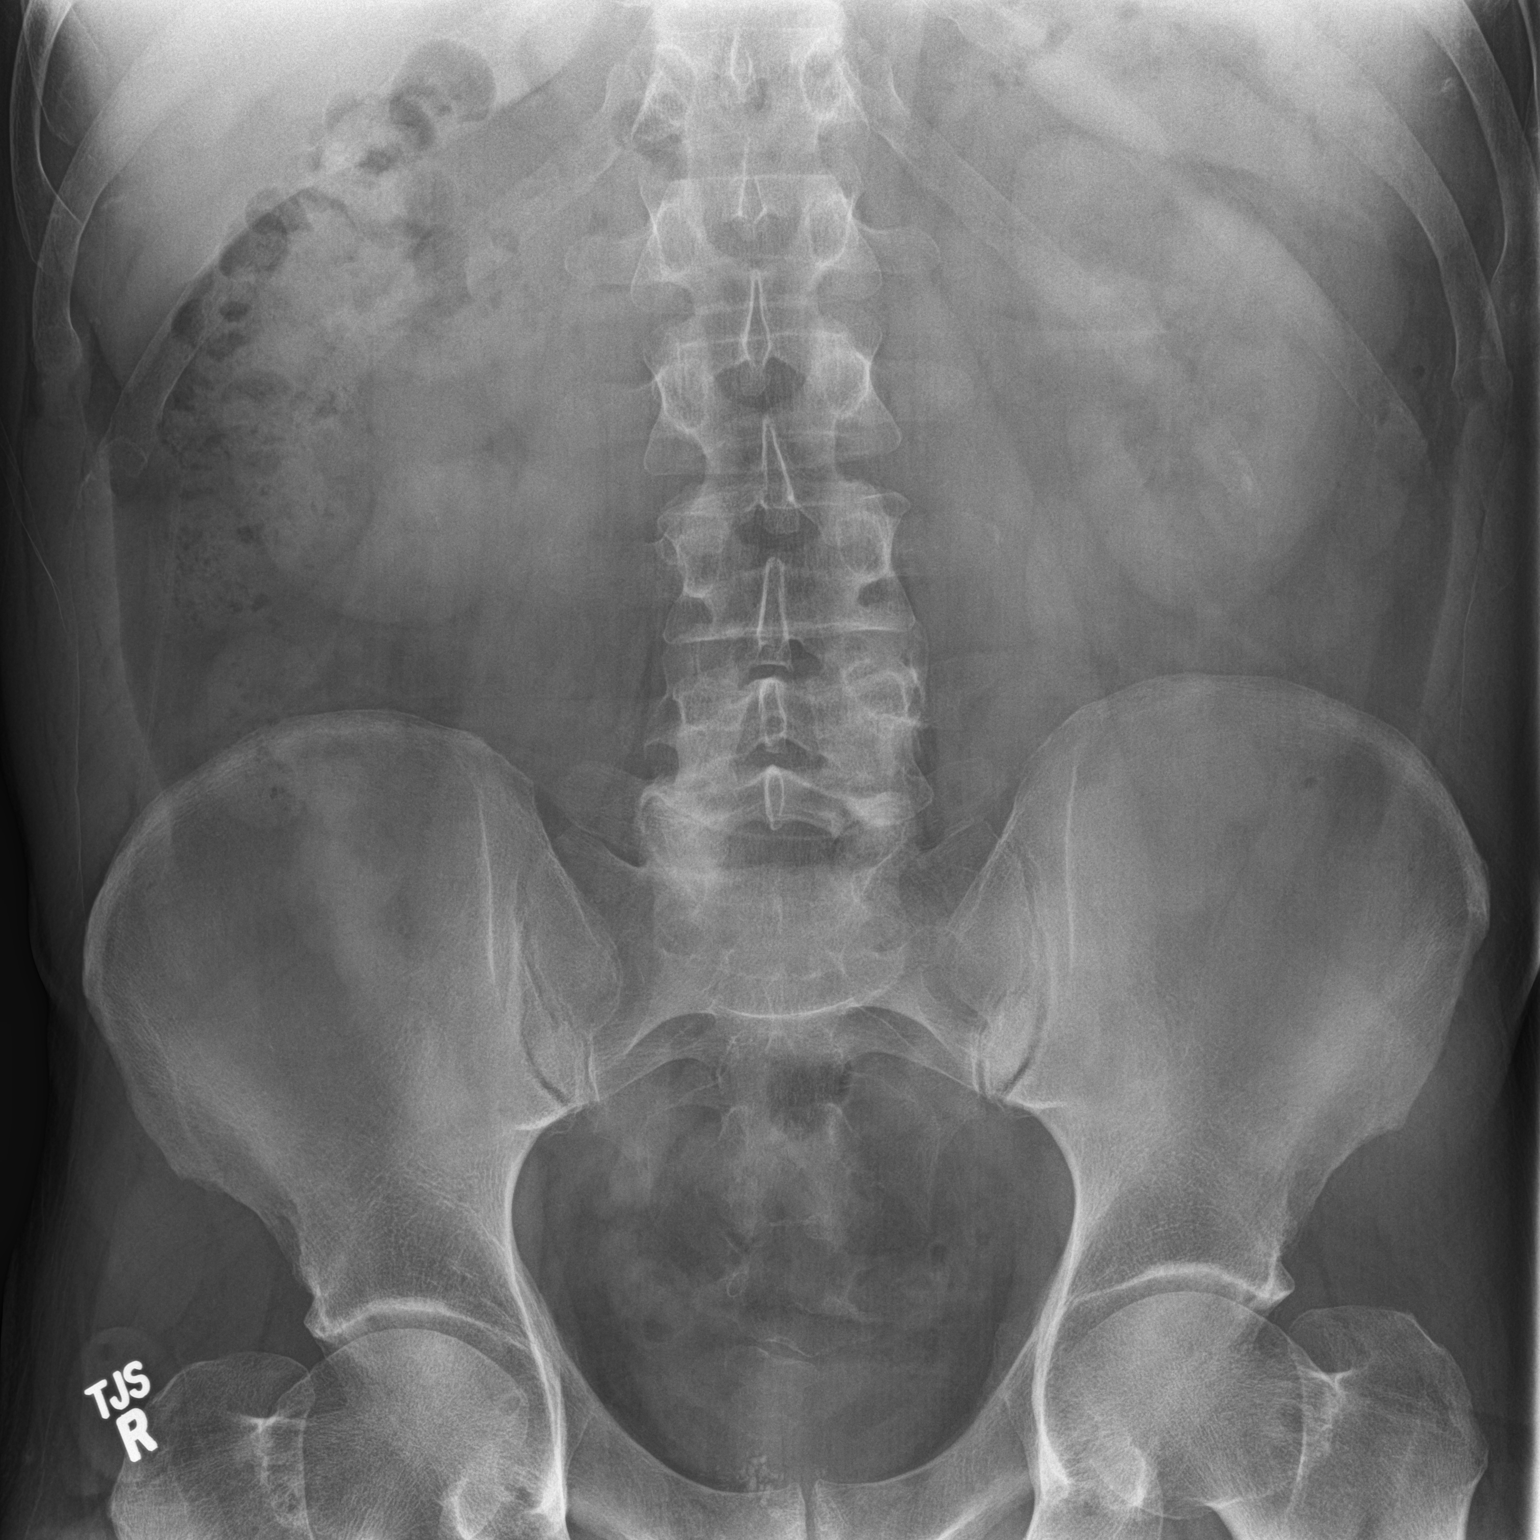

[1 of 1 positions shown; findings below may reference images not displayed]

FINDINGS: There are multiple prostatic calculi. No other abnormal
calcifications are evident currently. There is moderate stool
throughout the colon. The bowel gas pattern is normal. No bowel
obstruction or free air evident.
IMPRESSION: Bowel gas pattern within normal limits. Multiple prostatic calculi
present. No other calcifications evident.

## 2016-11-22 DIAGNOSIS — G8929 Other chronic pain: Secondary | ICD-10-CM | POA: Diagnosis not present

## 2016-11-22 DIAGNOSIS — M7989 Other specified soft tissue disorders: Secondary | ICD-10-CM | POA: Diagnosis not present

## 2016-11-22 DIAGNOSIS — M25562 Pain in left knee: Secondary | ICD-10-CM | POA: Diagnosis not present

## 2016-11-22 DIAGNOSIS — M1711 Unilateral primary osteoarthritis, right knee: Secondary | ICD-10-CM | POA: Diagnosis not present

## 2016-11-22 DIAGNOSIS — R030 Elevated blood-pressure reading, without diagnosis of hypertension: Secondary | ICD-10-CM | POA: Diagnosis not present

## 2016-11-22 DIAGNOSIS — M25561 Pain in right knee: Secondary | ICD-10-CM | POA: Diagnosis not present

## 2016-12-12 ENCOUNTER — Encounter: Payer: Self-pay | Admitting: Family Medicine

## 2016-12-12 ENCOUNTER — Ambulatory Visit (INDEPENDENT_AMBULATORY_CARE_PROVIDER_SITE_OTHER): Payer: Federal, State, Local not specified - PPO | Admitting: Family Medicine

## 2016-12-12 VITALS — BP 132/94 | HR 68 | Temp 98.3°F | Resp 16 | Ht 71.0 in | Wt 223.0 lb

## 2016-12-12 DIAGNOSIS — Z6831 Body mass index (BMI) 31.0-31.9, adult: Secondary | ICD-10-CM

## 2016-12-12 DIAGNOSIS — I10 Essential (primary) hypertension: Secondary | ICD-10-CM | POA: Insufficient documentation

## 2016-12-12 DIAGNOSIS — Z114 Encounter for screening for human immunodeficiency virus [HIV]: Secondary | ICD-10-CM

## 2016-12-12 DIAGNOSIS — R030 Elevated blood-pressure reading, without diagnosis of hypertension: Secondary | ICD-10-CM | POA: Diagnosis not present

## 2016-12-12 DIAGNOSIS — E669 Obesity, unspecified: Secondary | ICD-10-CM | POA: Diagnosis not present

## 2016-12-12 NOTE — Patient Instructions (Signed)

## 2016-12-12 NOTE — Progress Notes (Signed)
Patient: Bryan Cortez, Male    DOB: Sep 11, 1970, 46 y.o.   MRN: [id number] Visit Date: 12/12/2016  Today's Provider: Lavon Paganini, MD   Chief Complaint  Patient presents with  . Establish Care  . Annual Exam  . Elevated Blood Pressure   Subjective:    Annual physical exam Bryan Cortez is a 46 y.o. male who presents today to establish care and for health maintenance and complete physical. He feels well. He reports exercising about 5 times per week; cardio and weight training.Marland Kitchen He reports he is sleeping well. His last labs were checked a year through his PCP at [facility].  ----------------------------------------------------------------- Elevated Blood Pressure Pt has noticed elevated blood pressure readings at his previous PCP at [facility]. His PCP retired some time ago, and felt as if he has been shuffled around to different providers. He was told to start HCTZ 12.5 mg before he was given any options on how to lower blood pressure without medications. He would like to discuss these options today. He has lowered his sodium intake, eating healthier, increased his potassium. He has lost 14 pounds. He has noticed some headaches (which have improved since decreasing sodium), but no other HTN sx.  He is exercising 3x weekly for strength training and 3x weekly for aerobic exercise.  Review of Systems  Constitutional: Negative.   HENT: Negative.   Eyes: Positive for photophobia. Negative for pain, discharge, redness, itching and visual disturbance.  Respiratory: Negative.   Cardiovascular: Negative.   Gastrointestinal: Negative.   Endocrine: Negative.   Genitourinary: Negative.   Musculoskeletal: Negative.   Skin: Negative.   Allergic/Immunologic: Positive for environmental allergies. Negative for food allergies and immunocompromised state.  Neurological: Positive for headaches. Negative for dizziness, tremors, seizures, syncope, facial asymmetry, speech difficulty, weakness,  light-headedness and numbness.  Psychiatric/Behavioral: Negative.     Social History      He  reports that he has never smoked. He has never used smokeless tobacco. He reports that he does not drink alcohol or use drugs.       Social History   Social History  . Marital status: Married    Spouse name: Ramar Nobrega  . Number of children: 2  . Years of education: some college   Occupational History  .  [name]   Social History Main Topics  . Smoking status: Never Smoker  . Smokeless tobacco: Never Used  . Alcohol use No     Comment: rarely  . Drug use: No  . Sexual activity: Yes   Other Topics Concern  . None   Social History Narrative  . None    Past Medical History:  Diagnosis Date  . Kidney stone      Patient Active Problem List   Diagnosis Date Noted  . Elevated BP without diagnosis of hypertension 12/12/2016  . Obesity 12/12/2016  . Allergic rhinitis 11/24/2015  . Frequent headaches 11/24/2015    Past Surgical History:  Procedure Laterality Date  . TESTICLE SURGERY Bilateral 1974   undistended    Family History        Family Status  Relation Status  . Father Deceased  . Sister Alive  . Mother Deceased  . Brother Alive  . Neg Hx (Not Specified)        His family history includes Anxiety disorder in his brother; Bladder Cancer in his father; Cancer in his father and mother; Dementia in his father; Heart disease in his father; Kidney Stones  in his sister; Obesity in his sister; Other in his sister.     Allergies  Allergen Reactions  . Naproxen Hives     Current Outpatient Prescriptions:  .  Omega-3 1000 MG CAPS, Take by mouth., Disp: , Rfl:    Patient Care Team: Virginia Crews, MD as PCP - General (Family Medicine)      Objective:   Vitals: BP (!) 132/94 (BP Location: Left Arm, Patient Position: Sitting, Cuff Size: Large)   Pulse 68   Temp 98.3 F (36.8 C) (Oral)   Resp 16   Ht 5\' 11"  (1.803 m)   Wt 223 lb (101.2 kg)    BMI 31.10 kg/m    Vitals:   12/12/16 1509  BP: (!) 132/94  Pulse: 68  Resp: 16  Temp: 98.3 F (36.8 C)  TempSrc: Oral  Weight: 223 lb (101.2 kg)  Height: 5\' 11"  (1.803 m)     Physical Exam  Constitutional: He is oriented to person, place, and time. He appears well-developed and well-nourished. No distress.  HENT:  Head: Normocephalic and atraumatic.  Right Ear: External ear normal.  Left Ear: External ear normal.  Mouth/Throat: Oropharynx is clear and moist. No oropharyngeal exudate.  Eyes: Pupils are equal, round, and reactive to light. Conjunctivae and EOM are normal. No scleral icterus.  Neck: Neck supple. No thyromegaly present.  Cardiovascular: Normal rate, regular rhythm, normal heart sounds and intact distal pulses.   No murmur heard. Pulmonary/Chest: Effort normal and breath sounds normal. No respiratory distress. He has no wheezes. He has no rales.  Abdominal: Soft. Bowel sounds are normal. He exhibits no distension. There is no tenderness. There is no rebound and no guarding.  Musculoskeletal: He exhibits no edema or deformity.  Lymphadenopathy:    He has no cervical adenopathy.  Neurological: He is alert and oriented to person, place, and time. No cranial nerve deficit.  Strength and sensation to light touch intact in UE/LEs  Skin: Skin is warm and dry. No rash noted.  Psychiatric: He has a normal mood and affect. His behavior is normal.  Vitals reviewed.    Depression Screen PHQ 2/9 Scores 12/12/2016  PHQ - 2 Score 0     Assessment & Plan:     Routine Health Maintenance and Physical Exam  Exercise Activities and Dietary recommendations Goals    None      Immunization History  Administered Date(s) Administered  . Tdap 12/03/2013    Health Maintenance  Topic Date Due  . HIV Screening  11/06/1983  . INFLUENZA VACCINE  11/28/2016  . TETANUS/TDAP  12/04/2023     Discussed health benefits of physical activity, and encouraged him to engage in  regular exercise appropriate for his age and condition.   Screening HIV test    Elevated BP without diagnosis of hypertension BP is elevated today Will allow for 3 months of lifestyle interventions Encouraged patient and his wife that DASH diet and exercise are beneficial Check CMP F/u in 3 months Bring BP log to next visit Consider medication if remains elevated  Obesity Likely BMI elevated due to muscular build Screening lipid panel   --------------------------------------------------------------------  The entirety of the information documented in the History of Present Illness, Review of Systems and Physical Exam were personally obtained by me. Portions of this information were initially documented by Raquel Sarna Ratchford, CMA and reviewed by me for thoroughness and accuracy.    Lavon Paganini, MD  [facility]

## 2016-12-12 NOTE — Assessment & Plan Note (Signed)
BP is elevated today Will allow for 3 months of lifestyle interventions Encouraged patient and his wife that DASH diet and exercise are beneficial Check CMP F/u in 3 months Bring BP log to next visit Consider medication if remains elevated

## 2016-12-12 NOTE — Assessment & Plan Note (Signed)
Likely BMI elevated due to muscular build Screening lipid panel

## 2016-12-13 ENCOUNTER — Telehealth: Payer: Self-pay

## 2016-12-13 LAB — COMPREHENSIVE METABOLIC PANEL
A/G RATIO: 1.8 (ref 1.2–2.2)
ALT: 30 IU/L (ref 0–44)
AST: 23 IU/L (ref 0–40)
Albumin: 4.7 g/dL (ref 3.5–5.5)
Alkaline Phosphatase: 80 IU/L (ref 39–117)
BILIRUBIN TOTAL: 0.7 mg/dL (ref 0.0–1.2)
BUN/Creatinine Ratio: 12 (ref 9–20)
BUN: 14 mg/dL (ref 6–24)
CALCIUM: 9.6 mg/dL (ref 8.7–10.2)
CHLORIDE: 100 mmol/L (ref 96–106)
CO2: 24 mmol/L (ref 20–29)
Creatinine, Ser: 1.14 mg/dL (ref 0.76–1.27)
GFR, EST AFRICAN AMERICAN: 87 mL/min/{1.73_m2} (ref >59)
GFR, EST NON AFRICAN AMERICAN: 76 mL/min/{1.73_m2} (ref >59)
GLOBULIN, TOTAL: 2.6 g/dL (ref 1.5–4.5)
Glucose: 85 mg/dL (ref 65–99)
POTASSIUM: 4.6 mmol/L (ref 3.5–5.2)
SODIUM: 138 mmol/L (ref 134–144)
Total Protein: 7.3 g/dL (ref 6.0–8.5)

## 2016-12-13 LAB — LIPID PANEL
CHOL/HDL RATIO: 3 ratio (ref 0.0–5.0)
Cholesterol, Total: 162 mg/dL (ref 100–199)
HDL: 54 mg/dL (ref >39)
LDL Calculated: 96 mg/dL (ref 0–99)
TRIGLYCERIDES: 59 mg/dL (ref 0–149)
VLDL Cholesterol Cal: 12 mg/dL (ref 5–40)

## 2016-12-13 LAB — HIV ANTIBODY (ROUTINE TESTING W REFLEX): HIV SCREEN 4TH GENERATION: NONREACTIVE

## 2016-12-13 NOTE — Telephone Encounter (Signed)
-----   Message from Virginia Crews, MD sent at 12/13/2016  9:20 AM EDT ----- Labs look great, including electrolytes, kidney function, liver function, and choelsterol.  10 yr risk of heart disease or stroke is only 2% which is low.  Virginia Crews, MD, MPH [facility] 12/13/2016 9:20 AM

## 2016-12-13 NOTE — Telephone Encounter (Signed)
Left message advising pt. OK per DPR. 

## 2017-03-14 ENCOUNTER — Ambulatory Visit: Payer: Federal, State, Local not specified - PPO | Admitting: Family Medicine

## 2017-03-14 ENCOUNTER — Encounter: Payer: Self-pay | Admitting: Family Medicine

## 2017-03-14 VITALS — BP 152/82 | HR 68 | Temp 97.8°F | Resp 16 | Wt 223.0 lb

## 2017-03-14 DIAGNOSIS — I10 Essential (primary) hypertension: Secondary | ICD-10-CM

## 2017-03-14 DIAGNOSIS — Z6831 Body mass index (BMI) 31.0-31.9, adult: Secondary | ICD-10-CM

## 2017-03-14 DIAGNOSIS — E669 Obesity, unspecified: Secondary | ICD-10-CM

## 2017-03-14 MED ORDER — HYDROCHLOROTHIAZIDE 12.5 MG PO TABS: 12.5000 mg | ORAL_TABLET | Freq: Every day | ORAL | 1 refills | Status: DC

## 2017-03-14 NOTE — Patient Instructions (Signed)

## 2017-03-14 NOTE — Progress Notes (Signed)
Patient: Bryan Cortez Male    DOB: Jan 04, 1971   46 y.o.   MRN: [id number] Visit Date: 03/15/2017  Today's Provider: Lavon Paganini, MD   Chief Complaint  Patient presents with  . Hypertension   Subjective:    HPI  Hypertension, follow-up:  BP Readings from Last 3 Encounters:  03/14/17 (!) 152/82  12/12/16 (!) 132/94  12/22/15 (!) 150/96    He was last seen for hypertension 3 months ago.  BP at that visit was 132/94. Management since that visit includes none, other than to work on diet and exercise to try and lower BP. Also bring BP cuff in the office.  He reports good compliance with treatment. He is not having side effects.  He is exercising regularly. He is adherent to low salt diet.   Outside blood pressures are running 130-150's/80-90's. He is experiencing none.  Patient denies chest pain, chest pressure/discomfort, claudication, dyspnea, exertional chest pressure/discomfort, fatigue, irregular heart beat, lower extremity edema, near-syncope, orthopnea, palpitations, paroxysmal nocturnal dyspnea, syncope and tachypnea.   Cardiovascular risk factors include hypertension and male gender.  Use of agents associated with hypertension: none.    Wt Readings from Last 3 Encounters:  03/14/17 223 lb (101.2 kg)  12/12/16 223 lb (101.2 kg)  12/22/15 233 lb (105.7 kg)   ------------------------------------------------------------------------      Allergies  Allergen Reactions  . Naproxen Hives     Current Outpatient Medications:  .  hydrochlorothiazide (HYDRODIURIL) 12.5 MG tablet, Take 1 tablet (12.5 mg total) daily by mouth., Disp: 90 tablet, Rfl: 1 .  Omega-3 1000 MG CAPS, Take by mouth., Disp: , Rfl:   Review of Systems  Constitutional: Negative.   HENT: Negative.   Eyes: Negative.   Respiratory: Negative.   Cardiovascular: Negative.   Gastrointestinal: Negative.   Endocrine: Negative.   Genitourinary: Negative.   Musculoskeletal: Negative.     Skin: Negative.   Allergic/Immunologic: Negative.   Neurological: Negative.   Hematological: Negative.   Psychiatric/Behavioral: Negative.     Social History   Tobacco Use  . Smoking status: Never Smoker  . Smokeless tobacco: Never Used  Substance Use Topics  . Alcohol use: No    Comment: rarely   Objective:   BP (!) 152/82 (BP Location: Left Arm, Patient Position: Sitting, Cuff Size: Large)   Pulse 68   Temp 97.8 F (36.6 C) (Oral)   Resp 16   Wt 223 lb (101.2 kg)   BMI 31.10 kg/m  Vitals:   03/14/17 1507  BP: (!) 152/82  Pulse: 68  Resp: 16  Temp: 97.8 F (36.6 C)  TempSrc: Oral  Weight: 223 lb (101.2 kg)     Physical Exam  Constitutional: He is oriented to person, place, and time. He appears well-developed and well-nourished. No distress.  HENT:  Head: Normocephalic and atraumatic.  Eyes: Conjunctivae are normal. No scleral icterus.  Neck: Neck supple. No thyromegaly present.  Cardiovascular: Normal rate, regular rhythm, normal heart sounds and intact distal pulses.  No murmur heard. Pulmonary/Chest: Breath sounds normal. No respiratory distress. He has no wheezes. He has no rales.  Musculoskeletal: He exhibits no edema or deformity.  Lymphadenopathy:    He has no cervical adenopathy.  Neurological: He is alert and oriented to person, place, and time.  Skin: Skin is warm and dry.  Psychiatric: He has a normal mood and affect. His behavior is normal.  Vitals reviewed.       Assessment & Plan:  Problem List Items Addressed This Visit      Cardiovascular and Mediastinum   Hypertension - Primary    Uncontrolled despite 3 months of lifestyle interventions  encouraged him to continue with DASH diet and exercise  start HCTZ 12.5 mg daily Follow-up in and consider dose titration as needed recent CMP within normal limits      Relevant Medications   hydrochlorothiazide (HYDRODIURIL) 12.5 MG tablet     Other   Obesity    Discussed diet and  exercise         Return in about 2 months (around 05/14/2017) for HTN f/u.      The entirety of the information documented in the History of Present Illness, Review of Systems and Physical Exam were personally obtained by me. Portions of this information were initially documented by [place], [place] and reviewed by me for thoroughness and accuracy.    Lavon Paganini, MD  [facility]

## 2017-03-15 NOTE — Assessment & Plan Note (Addendum)
New diagnosis  uncontrolled despite 3 months of lifestyle interventions  encouraged him to continue with DASH diet and exercise  start HCTZ 12.5 mg daily Follow-up in and consider dose titration as needed recent CMP within normal limits

## 2017-03-15 NOTE — Assessment & Plan Note (Signed)
Discussed diet and exercise 

## 2017-04-02 DIAGNOSIS — L578 Other skin changes due to chronic exposure to nonionizing radiation: Secondary | ICD-10-CM | POA: Diagnosis not present

## 2017-04-02 DIAGNOSIS — Z872 Personal history of diseases of the skin and subcutaneous tissue: Secondary | ICD-10-CM | POA: Diagnosis not present

## 2017-04-02 DIAGNOSIS — Z85828 Personal history of other malignant neoplasm of skin: Secondary | ICD-10-CM | POA: Diagnosis not present

## 2017-04-02 DIAGNOSIS — Z86018 Personal history of other benign neoplasm: Secondary | ICD-10-CM | POA: Diagnosis not present

## 2017-05-06 DIAGNOSIS — D224 Melanocytic nevi of scalp and neck: Secondary | ICD-10-CM | POA: Diagnosis not present

## 2017-05-06 DIAGNOSIS — D485 Neoplasm of uncertain behavior of skin: Secondary | ICD-10-CM | POA: Diagnosis not present

## 2017-05-08 ENCOUNTER — Encounter: Payer: Self-pay | Admitting: Family Medicine

## 2017-05-08 ENCOUNTER — Ambulatory Visit: Payer: Federal, State, Local not specified - PPO | Admitting: Family Medicine

## 2017-05-08 VITALS — BP 138/92 | HR 73 | Temp 98.2°F | Resp 16 | Wt 228.0 lb

## 2017-05-08 DIAGNOSIS — E669 Obesity, unspecified: Secondary | ICD-10-CM

## 2017-05-08 DIAGNOSIS — Z6831 Body mass index (BMI) 31.0-31.9, adult: Secondary | ICD-10-CM

## 2017-05-08 DIAGNOSIS — I10 Essential (primary) hypertension: Secondary | ICD-10-CM | POA: Diagnosis not present

## 2017-05-08 NOTE — Patient Instructions (Signed)

## 2017-05-08 NOTE — Assessment & Plan Note (Signed)
Discussed diet and exercise 

## 2017-05-08 NOTE — Assessment & Plan Note (Signed)
Above goal today Has had increased stress and decreased exercise and worse eating habits over the holidays Will give 3 months of lifestyle interventions before making any medication changes Tolerating HCTZ well - continue Recent CP wnl

## 2017-05-08 NOTE — Progress Notes (Signed)
Patient: Bryan Cortez Male    DOB: 30-Apr-1971   47 y.o.   MRN: [id number] Visit Date: 05/08/2017  Today's Provider: Lavon Paganini, MD   I, Martha Clan, CMA, am acting as scribe for Lavon Paganini, MD.  Chief Complaint  Patient presents with  . Hypertension   Subjective:    HPI      Hypertension, follow-up:  BP Readings from Last 3 Encounters:  05/08/17 (!) 138/92  03/14/17 (!) 152/82  12/12/16 (!) 132/94    He was last seen for hypertension 2 months ago.  BP at that visit was 152/82. Management since that visit includes starting HCTZ 12.5 mg. He reports good compliance with treatment. He is not having side effects.  He is exercising. He is adherent to low salt diet.   Outside blood pressures are not being checked. He is experiencing fatigue.  Patient denies chest pain, chest pressure/discomfort, claudication, dyspnea, exertional chest pressure/discomfort, irregular heart beat, lower extremity edema, near-syncope, orthopnea, palpitations and syncope.   Cardiovascular risk factors include hypertension and male gender.    Weight trend: increasing steadily Wt Readings from Last 3 Encounters:  05/08/17 228 lb (103.4 kg)  03/14/17 223 lb (101.2 kg)  12/12/16 223 lb (101.2 kg)    Current diet: well balanced Unable to work out during month of December due to crazy work schedule.  Back in the gym now.   Also possibly related to stressful times with son this time ------------------------------------------------------------------------   Allergies  Allergen Reactions  . Naproxen Hives     Current Outpatient Medications:  .  hydrochlorothiazide (HYDRODIURIL) 12.5 MG tablet, Take 1 tablet (12.5 mg total) daily by mouth., Disp: 90 tablet, Rfl: 1  Review of Systems  Constitutional: Positive for fatigue. Negative for activity change, appetite change, chills, diaphoresis, fever and unexpected weight change.  Respiratory: Negative for shortness of breath.    Cardiovascular: Negative for chest pain, palpitations and leg swelling.    Social History   Tobacco Use  . Smoking status: Never Smoker  . Smokeless tobacco: Never Used  Substance Use Topics  . Alcohol use: No    Comment: rarely   Objective:   BP (!) 138/92 (BP Location: Left Arm, Patient Position: Sitting, Cuff Size: Large)   Pulse 73   Temp 98.2 F (36.8 C) (Oral)   Resp 16   Wt 228 lb (103.4 kg)   SpO2 96%   BMI 31.80 kg/m  Vitals:   05/08/17 1517  BP: (!) 138/92  Pulse: 73  Resp: 16  Temp: 98.2 F (36.8 C)  TempSrc: Oral  SpO2: 96%  Weight: 228 lb (103.4 kg)     Physical Exam  Constitutional: He appears well-developed and well-nourished. No distress.  HENT:  Head: Normocephalic and atraumatic.  Eyes: Conjunctivae are normal. No scleral icterus.  Cardiovascular: Normal rate, regular rhythm, normal heart sounds and intact distal pulses.  No murmur heard. Pulmonary/Chest: Effort normal. No respiratory distress. He has no wheezes. He has no rales.  Musculoskeletal: He exhibits no edema or deformity.  Neurological: He is alert.  Skin: Skin is warm and dry. No rash noted.  Psychiatric: He has a normal mood and affect. His behavior is normal.  Vitals reviewed.       Assessment & Plan:      Problem List Items Addressed This Visit      Cardiovascular and Mediastinum   Hypertension - Primary    Above goal today Has had increased stress and decreased exercise  and worse eating habits over the holidays Will give 3 months of lifestyle interventions before making any medication changes Tolerating HCTZ well - continue Recent CP wnl        Other   Obesity    Discussed diet and exercise         Return in about 3 months (around 08/06/2017) for BP f/u.     The entirety of the information documented in the History of Present Illness, Review of Systems and Physical Exam were personally obtained by me. Portions of this information were initially documented  by Raquel Sarna Ratchford, CMA and reviewed by me for thoroughness and accuracy.    Virginia Crews, MD, MPH [facility] 05/08/2017 4:46 PM

## 2017-05-23 DIAGNOSIS — K080 Exfoliation of teeth due to systemic causes: Secondary | ICD-10-CM | POA: Diagnosis not present

## 2017-07-14 ENCOUNTER — Encounter: Payer: Self-pay | Admitting: Gynecology

## 2017-07-14 ENCOUNTER — Other Ambulatory Visit: Payer: Self-pay

## 2017-07-14 ENCOUNTER — Ambulatory Visit
Admission: EM | Admit: 2017-07-14 | Discharge: 2017-07-14 | Disposition: A | Discharge status: Home / Self Care | Payer: Federal, State, Local not specified - PPO | Attending: Family Medicine | Admitting: Family Medicine

## 2017-07-14 DIAGNOSIS — R05 Cough: Secondary | ICD-10-CM | POA: Diagnosis not present

## 2017-07-14 DIAGNOSIS — M791 Myalgia, unspecified site: Secondary | ICD-10-CM

## 2017-07-14 DIAGNOSIS — R509 Fever, unspecified: Secondary | ICD-10-CM | POA: Diagnosis not present

## 2017-07-14 DIAGNOSIS — J111 Influenza due to unidentified influenza virus with other respiratory manifestations: Secondary | ICD-10-CM | POA: Diagnosis not present

## 2017-07-14 HISTORY — DX: Essential (primary) hypertension: I10

## 2017-07-14 LAB — RAPID INFLUENZA A&B ANTIGENS
Influenza A (ARMC): POSITIVE — AB
Influenza B (ARMC): NEGATIVE

## 2017-07-14 MED ORDER — HYDROCOD POLST-CPM POLST ER 10-8 MG/5ML PO SUER: 5.0000 mL | Freq: Two times a day (BID) | ORAL | 0 refills | Status: DC | PRN

## 2017-07-14 NOTE — Discharge Instructions (Signed)
Rest. Fluids.  Tylenol every 6-8 hours, Ibuprofen every 8 hours.  Cough medication as needed.  Take care  Dr. Lacinda Axon

## 2017-07-14 NOTE — ED Triage Notes (Signed)
Per patient x 1 week on and off cough / feeling cold and hot on and off.

## 2017-07-14 NOTE — ED Provider Notes (Signed)
[facility]  CSN: [id number] Arrival date & time: 07/14/17  1430  History   Chief Complaint Chief Complaint  Patient presents with  . Cough   HPI  47 year old male presents with cough, fever, body aches.  Patient has been sick for the past week.  He has had intermittent fever as well as chills.  Ongoing cough.  Body aches.  He has taken Tylenol without resolution.  He also reports runny nose.  No known exacerbating factors.  No reported sick contacts.  No other associated symptoms.  No other complaints at this time.  Past Medical History:  Diagnosis Date  . Hypertension   . Kidney stone    Patient Active Problem List   Diagnosis Date Noted  . Hypertension 12/12/2016  . Obesity 12/12/2016  . Allergic rhinitis 11/24/2015  . Frequent headaches 11/24/2015   Past Surgical History:  Procedure Laterality Date  . TESTICLE SURGERY Bilateral 1974   undistended   Home Medications    Prior to Admission medications   Medication Sig Start Date End Date Taking? Authorizing Provider  hydrochlorothiazide (HYDRODIURIL) 12.5 MG tablet Take 1 tablet (12.5 mg total) daily by mouth. 03/14/17  Yes Bacigalupo, Dionne Bucy, MD  chlorpheniramine-HYDROcodone (TUSSIONEX PENNKINETIC ER) 10-8 MG/5ML SUER Take 5 mLs by mouth every 12 (twelve) hours as needed. 07/14/17   Coral Spikes, DO    Family History Family History  Problem Relation Age of Onset  . Bladder Cancer Father   . Heart disease Father   . Cancer Father        bladder  . Dementia Father   . Kidney Stones Sister   . Obesity Sister   . Other Sister        prediabetes  . Cancer Mother        unknown CA  . Anxiety disorder Brother   . Kidney cancer Neg Hx   . Prostate cancer Neg Hx     Social History Social History   Tobacco Use  . Smoking status: Never Smoker  . Smokeless tobacco: Never Used  Substance Use Topics  . Alcohol use: No    Comment: rarely  . Drug use: No     Allergies   Naproxen   Review of  Systems Review of Systems Per HPI  Physical Exam Triage Vital Signs ED Triage Vitals  Enc Vitals Group     BP 07/14/17 1451 (!) 152/97     Pulse Rate 07/14/17 1451 (!) 108     Resp 07/14/17 1451 18     Temp 07/14/17 1451 99.2 F (37.3 C)     Temp Source 07/14/17 1451 Oral     SpO2 07/14/17 1451 97 %     Weight 07/14/17 1452 220 lb (99.8 kg)     Height 07/14/17 1452 6' (1.829 m)     Head Circumference --      Peak Flow --      Pain Score 07/14/17 1452 0     Pain Loc --      Pain Edu? --      Excl. in [place]? --    No data found.  Updated Vital Signs BP (!) 152/97 (BP Location: Left Arm)   Pulse (!) 108   Temp 99.2 F (37.3 C) (Oral)   Resp 18   Ht 6' (1.829 m)   Wt 220 lb (99.8 kg)   SpO2 97%   BMI 29.84 kg/m   Visual Acuity Right Eye Distance:   Left Eye Distance:  Bilateral Distance:    Right Eye Near:   Left Eye Near:    Bilateral Near:     Physical Exam  Constitutional: He is oriented to person, place, and time. He appears well-developed. No distress.  HENT:  Head: Normocephalic and atraumatic.  Mouth/Throat: Oropharynx is clear and moist.  Eyes: Conjunctivae are normal. Right eye exhibits no discharge. Left eye exhibits no discharge.  Cardiovascular: Normal rate and regular rhythm.  Pulmonary/Chest: Effort normal and breath sounds normal. He has no wheezes. He has no rales.  Neurological: He is alert and oriented to person, place, and time.  Psychiatric: He has a normal mood and affect. His behavior is normal.  Nursing note and vitals reviewed.  UC Treatments / Results  Labs (all labs ordered are listed, but only abnormal results are displayed) Labs Reviewed  RAPID INFLUENZA A&B ANTIGENS ([place]) - Abnormal; Notable for the following components:      Result Value   Influenza A ([facility]) POSITIVE (*)    All other components within normal limits    EKG  EKG Interpretation None       Radiology No results found.  Procedures Procedures  (including critical care time)  Medications Ordered in UC Medications - No data to display   Initial Impression / Assessment and Plan / UC Course  I have reviewed the triage vital signs and the nursing notes.  Pertinent labs & imaging results that were available during my care of the patient were reviewed by me and considered in my medical decision making (see chart for details).     47 year old male presents with influenza.  Patient is out of the treatment window.  Treating cough with Tussionex.  Advised over-the-counter Tylenol Motrin as needed.  Work note given.  Final Clinical Impressions(s) / UC Diagnoses   Final diagnoses:  Influenza    ED Discharge Orders        Ordered    chlorpheniramine-HYDROcodone (TUSSIONEX PENNKINETIC ER) 10-8 MG/5ML SUER  Every 12 hours PRN     07/14/17 1537     Controlled Substance Prescriptions [place] Controlled Substance Registry consulted? Not Applicable   Coral Spikes, DO 07/14/17 1636

## 2017-08-13 ENCOUNTER — Encounter: Payer: Self-pay | Admitting: Family Medicine

## 2017-08-13 ENCOUNTER — Ambulatory Visit: Payer: Federal, State, Local not specified - PPO | Admitting: Family Medicine

## 2017-08-13 VITALS — BP 150/98 | HR 80 | Temp 97.6°F | Resp 16 | Wt 230.0 lb

## 2017-08-13 DIAGNOSIS — I10 Essential (primary) hypertension: Secondary | ICD-10-CM | POA: Diagnosis not present

## 2017-08-13 MED ORDER — HYDROCHLOROTHIAZIDE 25 MG PO TABS
25.0000 mg | ORAL_TABLET | Freq: Every day | ORAL | 2 refills | Status: DC
Start: 2017-08-13 — End: 2018-05-27

## 2017-08-13 NOTE — Progress Notes (Signed)
Patient: Bryan Cortez Male    DOB: 09/14/1970   47 y.o.   MRN: [id number] Visit Date: 08/13/2017  Today's Provider: Lavon Paganini, MD   I, Martha Clan, CMA, am acting as scribe for Lavon Paganini, MD.  Chief Complaint  Patient presents with  . Hypertension   Subjective:    HPI      Hypertension, follow-up:  BP Readings from Last 3 Encounters:  08/13/17 (!) 150/98  07/14/17 (!) 152/97  05/08/17 (!) 138/92    He was last seen for hypertension 3 months ago.  BP at that visit was 138/92. Management since that visit includes advising pt to modify lifestyle for 3 months to help lower BP. If above goal at FU, PCP was going to consider adding antihypertensive. He reports fair compliance with treatment. He is not having side effects.  He is exercising. 3-4 times per week. Weight training and one day of cardio. He is adherent to low salt diet.   Outside blood pressures are 150's/90ish. He is experiencing none.  Patient denies chest pain, chest pressure/discomfort, claudication, dyspnea, exertional chest pressure/discomfort, fatigue, irregular heart beat, lower extremity edema, near-syncope, orthopnea, palpitations and syncope.   Cardiovascular risk factors include hypertension and male gender.  Use of agents associated with hypertension: none.     Weight trend: stable Wt Readings from Last 3 Encounters:  08/13/17 230 lb (104.3 kg)  07/14/17 220 lb (99.8 kg)  05/08/17 228 lb (103.4 kg)    Current diet: "probably bad." States he eats out for lunch every day.  ------------------------------------------------------------------------   Allergies  Allergen Reactions  . Naproxen Hives     Current Outpatient Medications:  .  hydrochlorothiazide (HYDRODIURIL) 12.5 MG tablet, Take 1 tablet (12.5 mg total) daily by mouth., Disp: 90 tablet, Rfl: 1 .  loratadine (CLARITIN) 10 MG tablet, Take 10 mg by mouth daily., Disp: , Rfl:   Review of Systems    Constitutional: Negative for activity change, appetite change, chills, diaphoresis, fatigue, fever and unexpected weight change.  Respiratory: Negative for shortness of breath.   Cardiovascular: Negative for chest pain, palpitations and leg swelling.    Social History   Tobacco Use  . Smoking status: Never Smoker  . Smokeless tobacco: Never Used  Substance Use Topics  . Alcohol use: No    Comment: rarely   Objective:   BP (!) 150/98 (BP Location: Left Arm, Patient Position: Sitting, Cuff Size: Large)   Pulse 80   Temp 97.6 F (36.4 C) (Oral)   Resp 16   Wt 230 lb (104.3 kg)   SpO2 97%   BMI 31.19 kg/m  Vitals:   08/13/17 0815  BP: (!) 150/98  Pulse: 80  Resp: 16  Temp: 97.6 F (36.4 C)  TempSrc: Oral  SpO2: 97%  Weight: 230 lb (104.3 kg)     Physical Exam  Constitutional: He is oriented to person, place, and time. He appears well-developed and well-nourished. No distress.  HENT:  Head: Normocephalic and atraumatic.  Eyes: Conjunctivae are normal. No scleral icterus.  Neck: Neck supple.  Cardiovascular: Normal rate, regular rhythm, normal heart sounds and intact distal pulses.  No murmur heard. Pulmonary/Chest: Effort normal and breath sounds normal. No respiratory distress. He has no wheezes. He has no rales.  Musculoskeletal: He exhibits no edema or deformity.  Neurological: He is alert and oriented to person, place, and time.  Skin: Skin is warm and dry. No rash noted.  Vitals reviewed.  Assessment & Plan:   Problem List Items Addressed This Visit      Cardiovascular and Mediastinum   Hypertension - Primary    Uncontrolled Tried 3 months of lifestyle interventions unsuccessfully Will increase HCTZ to 25mg  daily Recheck BMP F/u in 3 months      Relevant Medications   hydrochlorothiazide (HYDRODIURIL) 25 MG tablet   Other Relevant Orders   Basic metabolic panel       Return in about 3 months (around 11/12/2017) for BP f/u.   The  entirety of the information documented in the History of Present Illness, Review of Systems and Physical Exam were personally obtained by me. Portions of this information were initially documented by Raquel Sarna Ratchford, CMA and reviewed by me for thoroughness and accuracy.    Virginia Crews, MD, MPH [facility] 08/13/2017 10:17 AM

## 2017-08-13 NOTE — Assessment & Plan Note (Signed)
Uncontrolled Tried 3 months of lifestyle interventions unsuccessfully Will increase HCTZ to 25mg  daily Recheck BMP F/u in 3 months

## 2017-08-14 ENCOUNTER — Telehealth: Payer: Self-pay

## 2017-08-14 LAB — BASIC METABOLIC PANEL
BUN/Creatinine Ratio: 13 (ref 9–20)
BUN: 14 mg/dL (ref 6–24)
CO2: 23 mmol/L (ref 20–29)
CREATININE: 1.08 mg/dL (ref 0.76–1.27)
Calcium: 9.7 mg/dL (ref 8.7–10.2)
Chloride: 101 mmol/L (ref 96–106)
GFR calc Af Amer: 93 mL/min/{1.73_m2} (ref >59)
GFR calc non Af Amer: 81 mL/min/{1.73_m2} (ref >59)
GLUCOSE: 86 mg/dL (ref 65–99)
Potassium: 4.1 mmol/L (ref 3.5–5.2)
SODIUM: 141 mmol/L (ref 134–144)

## 2017-08-14 NOTE — Telephone Encounter (Signed)
-----   Message from Virginia Crews, MD sent at 08/14/2017  8:08 AM EDT ----- Normal kidney function and electrolytes  Bacigalupo, Dionne Bucy, MD, MPH [facility] 08/14/2017 8:08 AM

## 2017-08-14 NOTE — Telephone Encounter (Signed)
lmtcb and asked to view labs on MyChart.

## 2017-08-15 NOTE — Telephone Encounter (Signed)
Viewed by [name] on 08/14/2017 1:33 PM

## 2017-11-12 ENCOUNTER — Encounter: Payer: Self-pay | Admitting: Family Medicine

## 2017-11-12 ENCOUNTER — Ambulatory Visit: Payer: Federal, State, Local not specified - PPO | Admitting: Family Medicine

## 2017-11-12 VITALS — BP 132/90 | HR 57 | Temp 97.8°F | Resp 16 | Wt 227.0 lb

## 2017-11-12 DIAGNOSIS — I10 Essential (primary) hypertension: Secondary | ICD-10-CM | POA: Diagnosis not present

## 2017-11-12 MED ORDER — LISINOPRIL 20 MG PO TABS: 20.0000 mg | ORAL_TABLET | Freq: Every day | ORAL | 1 refills | Status: DC

## 2017-11-12 NOTE — Progress Notes (Signed)
Patient: Bryan Cortez Male    DOB: 1970/07/21   47 y.o.   MRN: [id number] Visit Date: 11/12/2017  Today's Provider: Lavon Paganini, MD   I, Bryan Cortez, CMA, am acting as scribe for Lavon Paganini, MD.  Chief Complaint  Patient presents with  . Hypertension   Subjective:    HPI      Hypertension, follow-up:  BP Readings from Last 3 Encounters:  11/12/17 132/90  08/13/17 (!) 150/98  07/14/17 (!) 152/97    He was last seen for hypertension 3 months ago.  BP at that visit was 150/98. Management since that visit includes increasing HCTZ to 25 mg qd (labs were WNL). He reports good compliance with treatment. He is not having side effects.  He is not exercising in the last couple of weeks. He is adherent to low salt diet.   Outside blood pressures are in the 140's/90's. He is experiencing none.  Patient denies chest pain, chest pressure/discomfort, claudication, dyspnea, exertional chest pressure/discomfort, fatigue, irregular heart beat, lower extremity edema, near-syncope, orthopnea, palpitations and syncope.   Cardiovascular risk factors include hypertension and male gender.  Use of agents associated with hypertension: none.     Weight trend: fluctuating a bit Wt Readings from Last 3 Encounters:  11/12/17 227 lb (103 kg)  08/13/17 230 lb (104.3 kg)  07/14/17 220 lb (99.8 kg)    Current diet: in general, a "healthy" diet  (unless he's on vacation.)  ------------------------------------------------------------------------   Allergies  Allergen Reactions  . Naproxen Hives     Current Outpatient Medications:  .  hydrochlorothiazide (HYDRODIURIL) 25 MG tablet, Take 1 tablet (25 mg total) by mouth daily., Disp: 90 tablet, Rfl: 2  Review of Systems  Constitutional: Negative for activity change, appetite change, chills, diaphoresis, fatigue, fever and unexpected weight change.  Respiratory: Negative for shortness of breath.   Cardiovascular:  Negative for chest pain, palpitations and leg swelling.    Social History   Tobacco Use  . Smoking status: Never Smoker  . Smokeless tobacco: Never Used  Substance Use Topics  . Alcohol use: No    Comment: rarely   Objective:   BP 132/90 (BP Location: Left Arm, Patient Position: Sitting, Cuff Size: Normal)   Pulse (!) 57   Temp 97.8 F (36.6 C) (Oral)   Resp 16   Wt 227 lb (103 kg)   SpO2 97%   BMI 30.79 kg/m  Vitals:   11/12/17 0809  BP: 132/90  Pulse: (!) 57  Resp: 16  Temp: 97.8 F (36.6 C)  TempSrc: Oral  SpO2: 97%  Weight: 227 lb (103 kg)     Physical Exam  Constitutional: He is oriented to person, place, and time. He appears well-developed and well-nourished. No distress.  HENT:  Head: Normocephalic and atraumatic.  Eyes: Conjunctivae are normal. No scleral icterus.  Neck: Neck supple. No thyromegaly present.  Cardiovascular: Normal rate, regular rhythm, normal heart sounds and intact distal pulses.  No murmur heard. Pulmonary/Chest: Effort normal and breath sounds normal. No respiratory distress. He has no wheezes. He has no rales.  Musculoskeletal: He exhibits no edema.  Lymphadenopathy:    He has no cervical adenopathy.  Neurological: He is alert and oriented to person, place, and time.  Skin: Skin is warm and dry. Capillary refill takes less than 2 seconds. No rash noted.  Psychiatric: He has a normal mood and affect. His behavior is normal.  Vitals reviewed.      Assessment &  Plan:     Problem List Items Addressed This Visit      Cardiovascular and Mediastinum   Hypertension - Primary    Uncontrolled Continue HCTZ 25mg  daily Add lisinopril 20mg  daily Follow home BPs At CPE in 1 month will check metabolic panel and ensure stable K and Cr Plan for combo lisinopril-hctz pill in the future after on stable dose      Relevant Medications   lisinopril (PRINIVIL,ZESTRIL) 20 MG tablet       Return in about 1 month (around 12/13/2017) for CPE  (after 8/15).   The entirety of the information documented in the History of Present Illness, Review of Systems and Physical Exam were personally obtained by me. Portions of this information were initially documented by Raquel Sarna Ratchford, CMA and reviewed by me for thoroughness and accuracy.    Virginia Crews, MD, MPH [facility] 11/12/2017 8:44 AM

## 2017-11-12 NOTE — Patient Instructions (Signed)
Continue HCTZ  Add lisinopril 20mg  daily

## 2017-11-12 NOTE — Assessment & Plan Note (Signed)
Uncontrolled Continue HCTZ 25mg  daily Add lisinopril 20mg  daily Follow home BPs At CPE in 1 month will check metabolic panel and ensure stable K and Cr Plan for combo lisinopril-hctz pill in the future after on stable dose

## 2017-11-14 DIAGNOSIS — K080 Exfoliation of teeth due to systemic causes: Secondary | ICD-10-CM | POA: Diagnosis not present

## 2017-12-04 ENCOUNTER — Encounter: Payer: Self-pay | Admitting: Family Medicine

## 2017-12-05 MED ORDER — HYDROCORTISONE ACETATE 25 MG RE SUPP: 25.0000 mg | Freq: Two times a day (BID) | RECTAL | 3 refills | Status: DC | PRN

## 2017-12-09 ENCOUNTER — Encounter: Payer: Self-pay | Admitting: Family Medicine

## 2017-12-10 ENCOUNTER — Encounter: Payer: Self-pay | Admitting: Family Medicine

## 2017-12-10 ENCOUNTER — Ambulatory Visit: Payer: Federal, State, Local not specified - PPO | Admitting: Family Medicine

## 2017-12-10 VITALS — BP 134/90 | HR 63 | Temp 98.1°F | Wt 219.6 lb

## 2017-12-10 DIAGNOSIS — K602 Anal fissure, unspecified: Secondary | ICD-10-CM

## 2017-12-10 MED ORDER — LIDOCAINE HCL 2 % EX GEL: 1.0000 "application " | Freq: Two times a day (BID) | CUTANEOUS | 1 refills | Status: DC

## 2017-12-10 NOTE — Patient Instructions (Signed)
Anal Fissure, Adult An anal fissure is a small tear or crack in the skin around the anus. Bleeding from a fissure usually stops on its own within a few minutes. However, bleeding will often occur again with each bowel movement until the crack heals. What are the causes? This condition may be caused by:  Passing large, hard stool (feces).  Frequent diarrhea.  Constipation.  Inflammatory bowel disease (Crohn disease or ulcerative colitis).  Infections.  Anal sex.  What are the signs or symptoms? Symptoms of this condition include:  Bleeding from the rectum.  Small amounts of blood seen on your stool, on toilet paper, or in the toilet after a bowel movement.  Painful bowel movements.  Itching or irritation around the anus.  How is this diagnosed? A health care provider may diagnose this condition by closely examining the anal area. An anal fissure can usually be seen with careful inspection. In some cases, a rectal exam may be performed, or a short tube (anoscope) may be used to examine the anal canal. How is this treated? Treatment for this condition may include:  Taking steps to avoid constipation. This may include making changes to your diet, such as increasing your intake of fiber or fluid.  Taking fiber supplements. These supplements can soften your stool to help make bowel movements easier. Your health care provider may also prescribe a stool softener if your stool is often hard.  Taking sitz baths. This may help to heal the tear.  Using medicated creams or ointments. These may be prescribed to lessen discomfort.  Follow these instructions at home: Eating and drinking  Avoid foods that may be constipating, such as bananas and dairy products.  Drink enough fluid to keep your urine clear or pale yellow.  Maintain a diet that is high in fruits, whole grains, and vegetables. General instructions  Keep the anal area as clean and dry as possible.  Take sitz baths as  told by your health care provider. Do not use soap in the sitz baths.  Take over-the-counter and prescription medicines only as told by your health care provider.  Use creams or ointments only as told by your health care provider.  Keep all follow-up visits as told by your health care provider. This is important. Contact a health care provider if:  You have more bleeding.  You have a fever.  You have diarrhea that is mixed with blood.  You continue to have pain.  Your problem is getting worse rather than better. This information is not intended to replace advice given to you by your health care provider. Make sure you discuss any questions you have with your health care provider. Document Released: 04/16/2005 Document Revised: 08/24/2015 Document Reviewed: 07/12/2014 Elsevier Interactive Patient Education  2018 [employer]  

## 2017-12-10 NOTE — Progress Notes (Signed)
Patient: Bryan Cortez Male    DOB: Jun 14, 1970   47 y.o.   MRN: [id number] Visit Date: 12/10/2017  Today's Provider: Lavon Paganini, MD   Chief Complaint  Patient presents with  . GI Problem   Subjective:    GI Problem  The primary symptoms include diarrhea. Primary symptoms do not include abdominal pain. The illness began 6 to 7 days ago. The problem has been gradually improving.  The diarrhea began 6 to 7 days ago. The diarrhea occurs 2 to 4 times per day.  Patient states he feels when he has a bowel movement it is "acidic". He states he has burning when having a bowel movement. He reports he has internal hemorrhoids. He has been using Hydrocortisone suppositories with some relief.   Diarrhea has only been present intermittently after meals.  No fevers, abd pain, N/V.  He is mostly concerned about burning pain of anus with BM.  This started after he strained to pass large and hard stool ~1 wk ago.    Allergies  Allergen Reactions  . Naproxen Hives     Current Outpatient Medications:  .  hydrochlorothiazide (HYDRODIURIL) 25 MG tablet, Take 1 tablet (25 mg total) by mouth daily., Disp: 90 tablet, Rfl: 2 .  hydrocortisone (ANUSOL-HC) 25 MG suppository, Place 1 suppository (25 mg total) rectally 2 (two) times daily as needed for hemorrhoids or anal itching., Disp: 24 suppository, Rfl: 3 .  lisinopril (PRINIVIL,ZESTRIL) 20 MG tablet, Take 1 tablet (20 mg total) by mouth daily. (Patient not taking: Reported on 12/10/2017), Disp: 30 tablet, Rfl: 1  Review of Systems  Constitutional: Negative.   Respiratory: Negative.   Cardiovascular: Negative.   Gastrointestinal: Positive for anal bleeding, diarrhea and rectal pain. Negative for abdominal pain.  Genitourinary: Negative.   Skin: Negative.   Neurological: Negative.   Hematological: Negative.   Psychiatric/Behavioral: Negative.     Social History   Tobacco Use  . Smoking status: Never Smoker  . Smokeless tobacco:  Never Used  Substance Use Topics  . Alcohol use: No    Comment: rarely   Objective:   BP 134/90 (BP Location: Right Arm, Patient Position: Sitting, Cuff Size: Large)   Pulse 63   Temp 98.1 F (36.7 C) (Oral)   Wt 219 lb 9.6 oz (99.6 kg)   SpO2 97%   BMI 29.78 kg/m    Physical Exam  Constitutional: He is oriented to person, place, and time. He appears well-developed and well-nourished. No distress.  HENT:  Head: Normocephalic and atraumatic.  Eyes: Conjunctivae are normal.  Cardiovascular: Normal rate, regular rhythm, normal heart sounds and intact distal pulses.  No murmur heard. Pulmonary/Chest: Effort normal and breath sounds normal. No respiratory distress. He has no wheezes. He has no rales.  Abdominal: Soft. Bowel sounds are normal. He exhibits no distension. There is no tenderness. There is no guarding.  Genitourinary: Rectal exam shows internal hemorrhoid and fissure. Rectal exam shows no external hemorrhoid and anal tone normal.  Musculoskeletal: He exhibits no edema.  Neurological: He is alert and oriented to person, place, and time.  Skin: Skin is warm and dry. Capillary refill takes less than 2 seconds. No rash noted.  Psychiatric: He has a normal mood and affect. His behavior is normal.  Vitals reviewed.       Assessment & Plan:   1. Anal fissure - New problem  - likely 2/2 constipation and passing large and hard stool - internal hemorrhoids are tender  but not very inflamed - can continue hydrocortison suppositories as these are helping - discussed importance of having regular soft BMs, avoiding being on toilet for too long - discussed natural course - keep area clean and dray - also advised barrier cream, sitz baths, tucks pads, and lidocaine jelly prn - return precautions discussed    Meds ordered this encounter  Medications  . lidocaine (XYLOCAINE) 2 % jelly    Sig: Apply 1 application topically 2 (two) times daily.    Dispense:  60 mL    Refill:   1     Return if symptoms worsen or fail to improve.   The entirety of the information documented in the History of Present Illness, Review of Systems and Physical Exam were personally obtained by me. Portions of this information were initially documented by Tiburcio Pea, CMA and reviewed by me for thoroughness and accuracy.    Virginia Crews, MD, MPH [facility] 12/11/2017 9:32 AM

## 2017-12-30 ENCOUNTER — Encounter: Payer: Self-pay | Admitting: Family Medicine

## 2018-01-02 ENCOUNTER — Ambulatory Visit (INDEPENDENT_AMBULATORY_CARE_PROVIDER_SITE_OTHER): Payer: Federal, State, Local not specified - PPO | Admitting: Family Medicine

## 2018-01-02 ENCOUNTER — Encounter: Payer: Self-pay | Admitting: Family Medicine

## 2018-01-02 VITALS — BP 146/98 | HR 75 | Temp 98.1°F | Ht 71.0 in | Wt 222.6 lb

## 2018-01-02 DIAGNOSIS — E669 Obesity, unspecified: Secondary | ICD-10-CM

## 2018-01-02 DIAGNOSIS — Z23 Encounter for immunization: Secondary | ICD-10-CM | POA: Diagnosis not present

## 2018-01-02 DIAGNOSIS — Z6831 Body mass index (BMI) 31.0-31.9, adult: Secondary | ICD-10-CM

## 2018-01-02 DIAGNOSIS — I10 Essential (primary) hypertension: Secondary | ICD-10-CM

## 2018-01-02 DIAGNOSIS — K602 Anal fissure, unspecified: Secondary | ICD-10-CM | POA: Insufficient documentation

## 2018-01-02 DIAGNOSIS — Z0000 Encounter for general adult medical examination without abnormal findings: Secondary | ICD-10-CM

## 2018-01-02 NOTE — Patient Instructions (Signed)

## 2018-01-02 NOTE — Progress Notes (Signed)
Patient: Bryan Cortez, Male    DOB: Feb 23, 1971, 47 y.o.   MRN: [id number] Visit Date: 01/02/2018  Today's Provider: Lavon Paganini, MD   Chief Complaint  Patient presents with  . Annual Exam  . Hypertension   Subjective:  I, Bryan Cortez, CMA, am acting as a scribe for Lavon Paganini, MD.    Annual physical exam Bryan Cortez is a 47 y.o. male who presents today for health maintenance and complete physical. He feels fairly well. He reports exercising 2 days per week. He reports he is sleeping well.  UTD on tetanus Agrees to flu shot No family history of colon cancer - will start colon cancer screening at age 25 -----------------------------------------------------------------  Hypertension, follow-up:  BP Readings from Last 3 Encounters:  01/02/18 (!) 146/98  12/10/17 134/90  11/12/17 132/90    He was last seen for hypertension 2 months ago.  BP at that visit was 132/90. Management changes since that visit include continue HCTZ 25 mg qd and add Lisinopril 20 mg qd. He reports fair compliance with treatment. He is not having side effects. Patient states he stopped taking Lisinopril due to "stomach issues."  Last 2 days, no longer having loose stools. He is exercising. He is adherent to low salt diet.   He is experiencing none.  Patient denies chest pain, chest pressure/discomfort, claudication, dyspnea, exertional chest pressure/discomfort, fatigue, irregular heart beat, lower extremity edema, near-syncope, orthopnea, palpitations, paroxysmal nocturnal dyspnea, syncope and tachypnea.   Cardiovascular risk factors include hypertension and male gender.  Use of agents associated with hypertension: none.     Weight trend: stable Wt Readings from Last 3 Encounters:  01/02/18 222 lb 9.6 oz (101 kg)  12/10/17 219 lb 9.6 oz (99.6 kg)  11/12/17 227 lb (103 kg)    Current diet: in general, a "healthy" diet     ------------------------------------------------------------------------ Anal fissure: Witch hazel pads, Desitin helping significantly.  Continues to use suppositories intermittently.    Review of Systems  Constitutional: Negative.   HENT: Negative.   Eyes: Negative.   Respiratory: Negative.   Cardiovascular: Negative.   Gastrointestinal: Positive for rectal pain.  Endocrine: Negative.   Genitourinary: Negative.   Musculoskeletal: Negative.   Skin: Negative.   Allergic/Immunologic: Negative.   Neurological: Negative.   Hematological: Negative.   Psychiatric/Behavioral: Negative.     Social History      He  reports that he has never smoked. He has never used smokeless tobacco. He reports that he does not drink alcohol or use drugs.       Social History   Socioeconomic History  . Marital status: Married    Spouse name: Bryan Cortez  . Number of children: 2  . Years of education: some college  . Highest education level: Not on file  Occupational History    Employer: [name]  Social Needs  . Financial resource strain: Not on file  . Food insecurity:    Worry: Not on file    Inability: Not on file  . Transportation needs:    Medical: Not on file    Non-medical: Not on file  Tobacco Use  . Smoking status: Never Smoker  . Smokeless tobacco: Never Used  Substance and Sexual Activity  . Alcohol use: No    Comment: rarely  . Drug use: No  . Sexual activity: Yes  Lifestyle  . Physical activity:    Days per week: Not on file    Minutes per  session: Not on file  . Stress: Not on file  Relationships  . Social connections:    Talks on phone: Not on file    Gets together: Not on file    Attends religious service: Not on file    Active member of club or organization: Not on file    Attends meetings of clubs or organizations: Not on file    Relationship status: Not on file  Other Topics Concern  . Not on file  Social History Narrative  . Not on file     Past Medical History:  Diagnosis Date  . Hypertension   . Kidney stone      Patient Active Problem List   Diagnosis Date Noted  . Hypertension 12/12/2016  . Obesity 12/12/2016  . Allergic rhinitis 11/24/2015  . Frequent headaches 11/24/2015    Past Surgical History:  Procedure Laterality Date  . TESTICLE SURGERY Bilateral 1974   undistended    Family History        Family Status  Relation Name Status  . Father  Deceased  . Sister  Alive  . Mother  Deceased  . Brother  Alive  . Neg Hx  (Not Specified)        His family history includes Anxiety disorder in his brother; Bladder Cancer in his father; Cancer in his father and mother; Dementia in his father; Heart disease in his father; Kidney Stones in his sister; Obesity in his sister; Other in his sister. There is no history of Kidney cancer or Prostate cancer.      Allergies  Allergen Reactions  . Naproxen Hives     Current Outpatient Medications:  .  hydrochlorothiazide (HYDRODIURIL) 25 MG tablet, Take 1 tablet (25 mg total) by mouth daily., Disp: 90 tablet, Rfl: 2 .  hydrocortisone (ANUSOL-HC) 25 MG suppository, Place 1 suppository (25 mg total) rectally 2 (two) times daily as needed for hemorrhoids or anal itching., Disp: 24 suppository, Rfl: 3 .  lidocaine (XYLOCAINE) 2 % jelly, Apply 1 application topically 2 (two) times daily., Disp: 60 mL, Rfl: 1 .  lisinopril (PRINIVIL,ZESTRIL) 20 MG tablet, Take 1 tablet (20 mg total) by mouth daily. (Patient not taking: Reported on 01/02/2018), Disp: 30 tablet, Rfl: 1   Patient Care Team: Virginia Crews, MD as PCP - General (Family Medicine)      Objective:   Vitals: BP (!) 146/98 (BP Location: Right Arm, Patient Position: Sitting, Cuff Size: Large)   Pulse 75   Temp 98.1 F (36.7 C) (Oral)   Ht 5\' 11"  (1.803 m)   Wt 222 lb 9.6 oz (101 kg)   SpO2 96%   BMI 31.05 kg/m    Vitals:   01/02/18 1514  BP: (!) 146/98  Pulse: 75  Temp: 98.1 F (36.7 C)   TempSrc: Oral  SpO2: 96%  Weight: 222 lb 9.6 oz (101 kg)  Height: 5\' 11"  (1.803 m)     Physical Exam  Constitutional: He is oriented to person, place, and time. He appears well-developed and well-nourished. No distress.  HENT:  Head: Normocephalic and atraumatic.  Right Ear: External ear normal.  Left Ear: External ear normal.  Nose: Nose normal.  Mouth/Throat: Oropharynx is clear and moist.  Eyes: Pupils are equal, round, and reactive to light. Conjunctivae and EOM are normal. No scleral icterus.  Neck: Neck supple. No thyromegaly present.  Cardiovascular: Normal rate, regular rhythm, normal heart sounds and intact distal pulses.  No murmur heard. Pulmonary/Chest: Effort normal and breath  sounds normal. No respiratory distress. He has no wheezes. He has no rales.  Abdominal: Soft. Bowel sounds are normal. He exhibits no distension. There is no tenderness. There is no rebound and no guarding.  Musculoskeletal: He exhibits no edema or deformity.  Lymphadenopathy:    He has no cervical adenopathy.  Neurological: He is alert and oriented to person, place, and time.  Skin: Skin is warm and dry. Capillary refill takes less than 2 seconds. No rash noted.  Varicose veins of bilateral lower extremities  Psychiatric: He has a normal mood and affect. His behavior is normal.  Vitals reviewed.    Depression Screen PHQ 2/9 Scores 01/02/2018 12/12/2016  PHQ - 2 Score 1 0  PHQ- 9 Score 2 -      Assessment & Plan:     Routine Health Maintenance and Physical Exam  Exercise Activities and Dietary recommendations Goals   None     Immunization History  Administered Date(s) Administered  . Tdap 12/03/2013    Health Maintenance  Topic Date Due  . INFLUENZA VACCINE  11/28/2017  . TETANUS/TDAP  12/04/2023  . HIV Screening  Completed     Discussed health benefits of physical activity, and encouraged him to engage in regular exercise appropriate for his age and condition.     --------------------------------------------------------------------  Problem List Items Addressed This Visit      Cardiovascular and Mediastinum   Hypertension    Uncontrolled Continue HCTZ mg daily D/c lisinopril - did not tolerate due to diarrhea and upset stomach Follow home BPs Recheck CMP F/u in 3 months Consider losartan if BP still elevated, but does not want to start another medication currently.      Relevant Orders   Comprehensive metabolic panel   Lipid panel     Digestive   Anal fissure    Slowly improving Continue current interventions - Tucks pads, barrier cream, suppositories Likely to be able to improve much easier as he is no longer having diarrhea Discussed return precautions If persists despite all of these conservative interventions, consider referral to gastroenterology        Other   Obesity    Discussed diet and exercise and healthy weight management Screening lipid panel today      Relevant Orders   Comprehensive metabolic panel   Lipid panel    Other Visit Diagnoses    Encounter for annual physical exam    -  Primary   Need for influenza vaccination       Relevant Orders   Flu Vaccine QUAD 6+ mos PF IM (Fluarix Quad PF) (Completed)       Return in about 3 months (around 04/03/2018) for BP f/u.   The entirety of the information documented in the History of Present Illness, Review of Systems and Physical Exam were personally obtained by me. Portions of this information were initially documented by Bryan Cortez, CMA and reviewed by me for thoroughness and accuracy.    Virginia Crews, MD, MPH [facility] 01/02/2018 4:28 PM

## 2018-01-02 NOTE — Assessment & Plan Note (Signed)
Discussed diet and exercise and healthy weight management Screening lipid panel today

## 2018-01-02 NOTE — Assessment & Plan Note (Signed)
Uncontrolled Continue HCTZ mg daily D/c lisinopril - did not tolerate due to diarrhea and upset stomach Follow home BPs Recheck CMP F/u in 3 months Consider losartan if BP still elevated, but does not want to start another medication currently.

## 2018-01-02 NOTE — Assessment & Plan Note (Signed)
Slowly improving Continue current interventions - Tucks pads, barrier cream, suppositories Likely to be able to improve much easier as he is no longer having diarrhea Discussed return precautions If persists despite all of these conservative interventions, consider referral to gastroenterology

## 2018-01-03 DIAGNOSIS — E669 Obesity, unspecified: Secondary | ICD-10-CM | POA: Diagnosis not present

## 2018-01-03 DIAGNOSIS — Z6831 Body mass index (BMI) 31.0-31.9, adult: Secondary | ICD-10-CM | POA: Diagnosis not present

## 2018-01-03 DIAGNOSIS — I10 Essential (primary) hypertension: Secondary | ICD-10-CM | POA: Diagnosis not present

## 2018-01-04 LAB — COMPREHENSIVE METABOLIC PANEL
ALK PHOS: 64 IU/L (ref 39–117)
ALT: 23 IU/L (ref 0–44)
AST: 19 IU/L (ref 0–40)
Albumin/Globulin Ratio: 2 (ref 1.2–2.2)
Albumin: 4.6 g/dL (ref 3.5–5.5)
BUN/Creatinine Ratio: 15 (ref 9–20)
BUN: 17 mg/dL (ref 6–24)
Bilirubin Total: 0.8 mg/dL (ref 0.0–1.2)
CHLORIDE: 100 mmol/L (ref 96–106)
CO2: 25 mmol/L (ref 20–29)
CREATININE: 1.1 mg/dL (ref 0.76–1.27)
Calcium: 9.7 mg/dL (ref 8.7–10.2)
GFR calc Af Amer: 91 mL/min/{1.73_m2} (ref >59)
GFR calc non Af Amer: 78 mL/min/{1.73_m2} (ref >59)
GLUCOSE: 88 mg/dL (ref 65–99)
Globulin, Total: 2.3 g/dL (ref 1.5–4.5)
Potassium: 4.1 mmol/L (ref 3.5–5.2)
Sodium: 140 mmol/L (ref 134–144)
Total Protein: 6.9 g/dL (ref 6.0–8.5)

## 2018-01-04 LAB — LIPID PANEL
CHOLESTEROL TOTAL: 181 mg/dL (ref 100–199)
Chol/HDL Ratio: 3.5 ratio (ref 0.0–5.0)
HDL: 52 mg/dL (ref >39)
LDL Calculated: 112 mg/dL — ABNORMAL HIGH (ref 0–99)
TRIGLYCERIDES: 86 mg/dL (ref 0–149)
VLDL CHOLESTEROL CAL: 17 mg/dL (ref 5–40)

## 2018-01-06 ENCOUNTER — Telehealth: Payer: Self-pay

## 2018-01-06 NOTE — Telephone Encounter (Signed)
-----   Message from Virginia Crews, MD sent at 01/06/2018  8:45 AM EDT ----- Normal blood sugar, liver function, kidney function, electrolytes.  Cholesterol is high, but 10 year risk of heart disease/stroke is low at 3.2 %.  No indication for medications at this time.  Do recommend regular exercise-30 minutes at least 5 times a week of dedicated exercise-and diet low in saturated fat.  Virginia Crews, MD, MPH [facility] 01/06/2018 8:45 AM

## 2018-01-06 NOTE — Telephone Encounter (Signed)
Pt returned call for lab results and request call back. Please advise. Thanks TNP

## 2018-01-06 NOTE — Telephone Encounter (Signed)
lmtcb

## 2018-01-06 NOTE — Telephone Encounter (Signed)
Pt advised.   Thanks,   -Ramonda Galyon  

## 2018-01-26 ENCOUNTER — Encounter: Payer: Self-pay | Admitting: Family Medicine

## 2018-02-25 ENCOUNTER — Encounter: Payer: Self-pay | Admitting: Family Medicine

## 2018-02-26 ENCOUNTER — Other Ambulatory Visit: Payer: Self-pay | Admitting: Family Medicine

## 2018-02-26 DIAGNOSIS — K6289 Other specified diseases of anus and rectum: Secondary | ICD-10-CM

## 2018-02-27 ENCOUNTER — Encounter: Payer: Self-pay | Admitting: Family Medicine

## 2018-02-27 ENCOUNTER — Ambulatory Visit: Payer: Federal, State, Local not specified - PPO | Admitting: Family Medicine

## 2018-02-27 VITALS — BP 138/78 | HR 68 | Temp 98.8°F | Resp 16 | Wt 225.0 lb

## 2018-02-27 DIAGNOSIS — K602 Anal fissure, unspecified: Secondary | ICD-10-CM

## 2018-02-27 MED ORDER — NITROGLYCERIN 0.4 % RE OINT: TOPICAL_OINTMENT | RECTAL | 2 refills | Status: DC

## 2018-02-27 NOTE — Patient Instructions (Signed)
Anal Fissure, Adult An anal fissure is a small tear or crack in the skin around the anus. Bleeding from a fissure usually stops on its own within a few minutes. However, bleeding will often occur again with each bowel movement until the crack heals. What are the causes? This condition may be caused by:  Passing large, hard stool (feces).  Frequent diarrhea.  Constipation.  Inflammatory bowel disease (Crohn disease or ulcerative colitis).  Infections.  Anal sex.  What are the signs or symptoms? Symptoms of this condition include:  Bleeding from the rectum.  Small amounts of blood seen on your stool, on toilet paper, or in the toilet after a bowel movement.  Painful bowel movements.  Itching or irritation around the anus.  How is this diagnosed? A health care provider may diagnose this condition by closely examining the anal area. An anal fissure can usually be seen with careful inspection. In some cases, a rectal exam may be performed, or a short tube (anoscope) may be used to examine the anal canal. How is this treated? Treatment for this condition may include:  Taking steps to avoid constipation. This may include making changes to your diet, such as increasing your intake of fiber or fluid.  Taking fiber supplements. These supplements can soften your stool to help make bowel movements easier. Your health care provider may also prescribe a stool softener if your stool is often hard.  Taking sitz baths. This may help to heal the tear.  Using medicated creams or ointments. These may be prescribed to lessen discomfort.  Follow these instructions at home: Eating and drinking  Avoid foods that may be constipating, such as bananas and dairy products.  Drink enough fluid to keep your urine clear or pale yellow.  Maintain a diet that is high in fruits, whole grains, and vegetables. General instructions  Keep the anal area as clean and dry as possible.  Take sitz baths as  told by your health care provider. Do not use soap in the sitz baths.  Take over-the-counter and prescription medicines only as told by your health care provider.  Use creams or ointments only as told by your health care provider.  Keep all follow-up visits as told by your health care provider. This is important. Contact a health care provider if:  You have more bleeding.  You have a fever.  You have diarrhea that is mixed with blood.  You continue to have pain.  Your problem is getting worse rather than better. This information is not intended to replace advice given to you by your health care provider. Make sure you discuss any questions you have with your health care provider. Document Released: 04/16/2005 Document Revised: 08/24/2015 Document Reviewed: 07/12/2014 Elsevier Interactive Patient Education  2018 [employer]  

## 2018-02-27 NOTE — Progress Notes (Addendum)
See other note

## 2018-02-27 NOTE — Progress Notes (Signed)
Patient: Bryan Cortez Male    DOB: 1971/03/01   47 y.o.   MRN: [id number] Visit Date: 02/27/2018  Today's Provider: Lavon Paganini, MD   Chief Complaint  Patient presents with  . Rectal Pain   Subjective:    HPI    Patient has been treating anal fissure for >1 month.  He was better at the beginning of the month.  He thinks it began hurting again after eating some popcorn and peanuts.  Now having sharp pains with passing BMs (feels like passing razor blades).  Dull ache is present all the time.  Has tried hydrocortisone suppositories, lidocaine jelly, A&D ointment, and tucks wipes.  BMs are soft.     Allergies  Allergen Reactions  . Naproxen Hives     Current Outpatient Medications:  .  hydrochlorothiazide (HYDRODIURIL) 25 MG tablet, Take 1 tablet (25 mg total) by mouth daily., Disp: 90 tablet, Rfl: 2 .  hydrocortisone (ANUSOL-HC) 25 MG suppository, Place 1 suppository (25 mg total) rectally 2 (two) times daily as needed for hemorrhoids or anal itching., Disp: 24 suppository, Rfl: 3 .  lidocaine (XYLOCAINE) 2 % jelly, Apply 1 application topically 2 (two) times daily., Disp: 60 mL, Rfl: 1 .  Nitroglycerin 0.4 % OINT, Apply rectally twice daily, Disp: 30 g, Rfl: 2  Review of Systems  Constitutional: Negative.   Respiratory: Negative.   Cardiovascular: Negative.   Gastrointestinal: Positive for rectal pain. Negative for abdominal distention, abdominal pain, anal bleeding, blood in stool, constipation, diarrhea, nausea and vomiting.  Musculoskeletal: Negative.     Social History   Tobacco Use  . Smoking status: Never Smoker  . Smokeless tobacco: Never Used  Substance Use Topics  . Alcohol use: No    Comment: rarely   Objective:   BP 138/78 (BP Location: Right Arm, Patient Position: Sitting, Cuff Size: Large)   Pulse 68   Temp 98.8 F (37.1 C) (Oral)   Resp 16   Wt 225 lb (102.1 kg)   BMI 31.38 kg/m  Vitals:   02/27/18 1131  BP: 138/78  Pulse: 68    Resp: 16  Temp: 98.8 F (37.1 C)  TempSrc: Oral  Weight: 225 lb (102.1 kg)     Physical Exam  Constitutional: He is oriented to person, place, and time. He appears well-developed and well-nourished. No distress.  HENT:  Head: Normocephalic and atraumatic.  Mouth/Throat: Oropharynx is clear and moist.  Eyes: Conjunctivae are normal. No scleral icterus.  Cardiovascular: Normal rate, regular rhythm and normal heart sounds.  Pulmonary/Chest: Effort normal and breath sounds normal. No respiratory distress. He has no wheezes. He has no rales.  Abdominal: Soft. He exhibits no distension. There is no tenderness.  Genitourinary: Rectal exam shows fissure (anterior) and tenderness. Rectal exam shows no external hemorrhoid, no internal hemorrhoid, no mass and anal tone normal.  Neurological: He is alert and oriented to person, place, and time.  Skin: Skin is warm and dry. Capillary refill takes less than 2 seconds. No rash noted.  Psychiatric: He has a normal mood and affect. His behavior is normal.  Vitals reviewed.       Assessment & Plan:   Problem List Items Addressed This Visit      Digestive   Anal fissure - Primary    Had improved, but now recurrent Continue current interventions Will add topical nitroglycerin Sitz baths and fiber recommended Referral to surgery for further eval/management (had previously referred to GI, but may be able  to see surgery sooner      Relevant Orders   Ambulatory referral to General Surgery       Return if symptoms worsen or fail to improve.   The entirety of the information documented in the History of Present Illness, Review of Systems and Physical Exam were personally obtained by me. Portions of this information were initially documented by Lenor Coffin nand Ashley Royalty, CMA and reviewed by me for thoroughness and accuracy.    Virginia Crews, MD, MPH [facility] 02/27/2018 2:53 PM

## 2018-02-27 NOTE — Assessment & Plan Note (Signed)
Had improved, but now recurrent Continue current interventions Will add topical nitroglycerin Sitz baths and fiber recommended Referral to surgery for further eval/management (had previously referred to GI, but may be able to see surgery sooner

## 2018-03-05 ENCOUNTER — Ambulatory Visit: Payer: Self-pay | Admitting: General Surgery

## 2018-04-02 NOTE — Progress Notes (Signed)
Patient: Bryan Cortez Male    DOB: [birth year]   47 y.o.   MRN: [id number] Visit Date: 04/03/2018  Today's Provider: Lavon Paganini, MD   Chief Complaint  Patient presents with  . Hypertension   Subjective:    HPI  Hypertension, follow-up:     BP Readings from Last 3 Encounters:  01/02/18 (!) 146/98  12/10/17 134/90  11/12/17 132/90    He was last seen for hypertension 3 months ago.  BP at that visit was 146/98. Management changes since that visit include continue HCTZ 25 mg qd and D/C Lisinopril 20 mg due to diarrhea. He reports good compliance with treatment. He is not having side effects.  He is exercising. He is adherent to low salt diet.   He is experiencing none.  Patient denies chest pain, chest pressure/discomfort, claudication, dyspnea, exertional chest pressure/discomfort, fatigue, irregular heart beat, lower extremity edema, near-syncope, orthopnea, palpitations, paroxysmal nocturnal dyspnea, syncope and tachypnea.   Cardiovascular risk factors include hypertension and male gender.  Use of agents associated with hypertension: none.               Weight trend: stable    Wt Readings from Last 3 Encounters:  01/02/18 222 lb 9.6 oz (101 kg)  12/10/17 219 lb 9.6 oz (99.6 kg)  11/12/17 227 lb (103 kg)    Current diet: in general, a "healthy" diet    Anal fissure: Healing well since using Nitroglycerin.  He has stopped using this.  Pain and bleeding are gone.   Allergies  Allergen Reactions  . Naproxen Hives     Current Outpatient Medications:  .  hydrochlorothiazide (HYDRODIURIL) 25 MG tablet, Take 1 tablet (25 mg total) by mouth daily., Disp: 90 tablet, Rfl: 2  Review of Systems  Constitutional: Negative.   Respiratory: Negative.   Cardiovascular: Negative.   Genitourinary: Negative.   Musculoskeletal: Negative.   Neurological: Negative.     Social History   Tobacco Use  . Smoking status: Never Smoker  . Smokeless tobacco: Never  Used  Substance Use Topics  . Alcohol use: No    Comment: rarely   Objective:   BP 136/89 (BP Location: Right Arm, Patient Position: Sitting, Cuff Size: Normal)   Pulse (!) 56   Temp 98.1 F (36.7 C) (Oral)   Wt 228 lb 6.4 oz (103.6 kg)   SpO2 95%   BMI 31.86 kg/m  Vitals:   04/03/18 0919  BP: 136/89  Pulse: (!) 56  Temp: 98.1 F (36.7 C)  TempSrc: Oral  SpO2: 95%  Weight: 228 lb 6.4 oz (103.6 kg)     Physical Exam  Constitutional: He is oriented to person, place, and time. He appears well-developed and well-nourished. No distress.  HENT:  Head: Normocephalic and atraumatic.  Eyes: Conjunctivae are normal. No scleral icterus.  Cardiovascular: Normal rate, regular rhythm, normal heart sounds and intact distal pulses.  No murmur heard. Pulmonary/Chest: Effort normal and breath sounds normal. No respiratory distress. He has no wheezes. He has no rales.  Musculoskeletal: He exhibits no edema.  Neurological: He is alert and oriented to person, place, and time.  Skin: Skin is warm and dry. Capillary refill takes less than 2 seconds. No rash noted.  Psychiatric: He has a normal mood and affect. His behavior is normal.  Vitals reviewed.       Assessment & Plan:    Problem List Items Addressed This Visit      Cardiovascular and Mediastinum  Hypertension - Primary    Well controlled Continue HCTZ 25mg  daily Reviewed recent Cr        Digestive   Anal fissure    Resolved Has stopped all treatments Discussed return precautions          Return in about 6 months (around 10/03/2018) for BP f/u.   The entirety of the information documented in the History of Present Illness, Review of Systems and Physical Exam were personally obtained by me. Portions of this information were initially documented by Tiburcio Pea, CMA and reviewed by me for thoroughness and accuracy.    Virginia Crews, MD, MPH [facility] 04/03/2018 1:26 PM

## 2018-04-03 ENCOUNTER — Encounter: Payer: Self-pay | Admitting: Family Medicine

## 2018-04-03 ENCOUNTER — Ambulatory Visit (INDEPENDENT_AMBULATORY_CARE_PROVIDER_SITE_OTHER): Payer: Federal, State, Local not specified - PPO | Admitting: Family Medicine

## 2018-04-03 VITALS — BP 136/89 | HR 56 | Temp 98.1°F | Wt 228.4 lb

## 2018-04-03 DIAGNOSIS — I10 Essential (primary) hypertension: Secondary | ICD-10-CM

## 2018-04-03 DIAGNOSIS — K602 Anal fissure, unspecified: Secondary | ICD-10-CM | POA: Diagnosis not present

## 2018-04-03 NOTE — Assessment & Plan Note (Signed)
Resolved Has stopped all treatments Discussed return precautions

## 2018-04-03 NOTE — Assessment & Plan Note (Signed)
Well controlled Continue HCTZ 25mg  daily Reviewed recent Cr

## 2018-04-14 ENCOUNTER — Ambulatory Visit: Payer: Federal, State, Local not specified - PPO | Admitting: Gastroenterology

## 2018-04-14 ENCOUNTER — Encounter

## 2018-05-06 ENCOUNTER — Encounter: Payer: Self-pay | Admitting: *Deleted

## 2018-05-19 DIAGNOSIS — K080 Exfoliation of teeth due to systemic causes: Secondary | ICD-10-CM | POA: Diagnosis not present

## 2018-05-27 ENCOUNTER — Other Ambulatory Visit: Payer: Self-pay | Admitting: Family Medicine

## 2018-05-27 MED ORDER — HYDROCHLOROTHIAZIDE 25 MG PO TABS: 25.0000 mg | ORAL_TABLET | Freq: Every day | ORAL | 2 refills | Status: DC

## 2018-05-27 NOTE — Telephone Encounter (Signed)
[name] [phone]  [facility] in [place]   hydrochlorothiazide (HYDRODIURIL) 25 MG tablet   Bryan Cortez stop by to see if he could get a refill on the above listed medication, he is down to 5 pills.

## 2018-06-16 ENCOUNTER — Other Ambulatory Visit: Payer: Self-pay

## 2018-06-16 ENCOUNTER — Encounter: Payer: Self-pay | Admitting: General Surgery

## 2018-06-16 ENCOUNTER — Ambulatory Visit (INDEPENDENT_AMBULATORY_CARE_PROVIDER_SITE_OTHER): Payer: Federal, State, Local not specified - PPO | Admitting: General Surgery

## 2018-06-16 VITALS — BP 148/89 | HR 71 | Temp 97.7°F | Ht 72.0 in | Wt 227.0 lb

## 2018-06-16 DIAGNOSIS — K602 Anal fissure, unspecified: Secondary | ICD-10-CM

## 2018-06-16 NOTE — Progress Notes (Signed)
Patient ID: Bryan Cortez, male   DOB: Jun 08, 1970, 48 y.o.   MRN: [id number]  Chief Complaint  Patient presents with  . New Patient (Initial Visit)    anal fissure    HPI CADIN Bryan Cortez is a 48 y.o. male.   He has been referred by his primary care doctor, Dr. Lavon Paganini, for evaluation of an anal fissure.  He states that he had 1 in his 3s that healed on its own.  He did well until the end of last summer.  He states that he was started on a new medication that caused him to have a lot of diarrhea.  This resulted in anal irritation.  He saw Dr. Brita Romp who diagnosed him with an anal fissure.  He started using Anusol and a nitroglycerin topical agent that he says worked really well.  He had been referred to see Bryan Cortez, however he canceled his appointment due to the improvement in his symptoms.  He states that recently, his symptoms have worsened again and he is interested in learning whether or not there might be any additional therapy options.  He states that his main symptom is that about 3 hours after he has a bowel movement, he experiences an aching pain in his anal rectal area.  It does not occur when he actually has a bowel movement.  The pain is neither sharp nor stabbing or tearing in nature.  He denies constipation and says that he has regular, soft, formed bowel movements.   Past Medical History:  Diagnosis Date  . Hypertension   . Kidney stone     Past Surgical History:  Procedure Laterality Date  . TESTICLE SURGERY Bilateral 1974   undistended    Family History  Problem Relation Age of Onset  . Bladder Cancer Father   . Heart disease Father   . Cancer Father        bladder  . Dementia Father   . Kidney Stones Sister   . Obesity Sister   . Other Sister        prediabetes  . Cancer Mother        unknown CA  . Anxiety disorder Brother   . Kidney cancer Neg Hx   . Prostate cancer Neg Hx     Social History Social History   Tobacco Use  . Smoking status: Never  Smoker  . Smokeless tobacco: Never Used  Substance Use Topics  . Alcohol use: No    Comment: rarely  . Drug use: No    Allergies  Allergen Reactions  . Naproxen Hives    Current Outpatient Medications  Medication Sig Dispense Refill  . hydrochlorothiazide (HYDRODIURIL) 25 MG tablet Take 1 tablet (25 mg total) by mouth daily. 90 tablet 2  . Nitroglycerin 0.4 % OINT Place rectally.    . hydrocortisone (ANUSOL-HC) 25 MG suppository Place 25 mg rectally 2 (two) times daily.     No current facility-administered medications for this visit.     Review of Systems Review of Systems  All other systems reviewed and are negative.   Blood pressure (!) 148/89, pulse 71, temperature 97.7 F (36.5 C), temperature source Skin, height 6' (1.829 m), weight 227 lb (103 kg), SpO2 98 %.  Physical Exam Physical Exam Vitals signs reviewed. Exam conducted with a chaperone present.  Constitutional:      General: He is not in acute distress.    Appearance: Normal appearance.  HENT:     Head: Normocephalic and atraumatic.  Nose: No congestion or rhinorrhea.     Mouth/Throat:     Mouth: Mucous membranes are moist.     Pharynx: Oropharynx is clear.  Eyes:     General: No scleral icterus.       Right eye: No discharge.        Left eye: No discharge.     Pupils: Pupils are equal, round, and reactive to light.  Neck:     Musculoskeletal: Normal range of motion. No neck rigidity.  Cardiovascular:     Rate and Rhythm: Normal rate and regular rhythm.     Pulses: Normal pulses.  Pulmonary:     Effort: Pulmonary effort is normal.     Breath sounds: Normal breath sounds.  Abdominal:     General: Abdomen is flat. Bowel sounds are normal.     Palpations: Abdomen is soft.  Genitourinary:    Rectum: Tenderness present. No mass, external hemorrhoid or internal hemorrhoid. Abnormal anal tone.     Comments: Increased anal sphincter tone. No clear fissure appreciated on exam, however patient  tolerance was limited due to pain. Lymphadenopathy:     Cervical: No cervical adenopathy.  Neurological:     Mental Status: He is alert.     Data Reviewed Dr. Nancy Nordmann clinic note from October 2019 was reviewed, including the presence of pain with defecation ("like passing razor blades").  Her subsequent clinic note from December 2019 reports that the fissure healed with use of NTG ointment and that the pain and bleeding had resolved.  Assessment    Based upon his response to physical examination, it seems likely that [name] does have recurrence of his anal fissure.    Plan    Before proceeding to more aggressive intervention, such as Botox injection or lateral internal sphincterotomy, we will first try a topical blend of nifedipine and lidocaine.  This is prepared by compounding pharmacy locally.  He was given a prescription for this and should use it 3 times a day for 6 weeks.  He will return to our clinic at that time for repeat evaluation.  Should his fissure not resolved with this treatment, we may need to plan for more aggressive intervention such as those options discussed above.       [name] 06/16/2018, 4:17 PM

## 2018-06-16 NOTE — Patient Instructions (Addendum)
Rx cream sent to [facility] drug store in [place]. Return in 6 weeks.   [address], [place],  [address] Phone: [phone]

## 2018-07-31 ENCOUNTER — Institutional Professional Consult (permissible substitution): Payer: Federal, State, Local not specified - PPO | Admitting: General Surgery

## 2018-08-06 ENCOUNTER — Other Ambulatory Visit: Payer: Self-pay

## 2018-08-06 ENCOUNTER — Ambulatory Visit (INDEPENDENT_AMBULATORY_CARE_PROVIDER_SITE_OTHER): Payer: Federal, State, Local not specified - PPO | Admitting: General Surgery

## 2018-08-06 DIAGNOSIS — K602 Anal fissure, unspecified: Secondary | ICD-10-CM

## 2018-08-06 NOTE — Progress Notes (Signed)
Virtual Visit via Telephone Note  I connected with Bryan Cortez on 08/06/18 at 10:00 AM EDT by telephone and verified that I am speaking with the correct person using two identifiers.   I discussed the limitations, risks, security and privacy concerns of performing an evaluation and management service by telephone and the availability of in person appointments. I also discussed with the patient that there may be a patient responsible charge related to this service. The patient expressed understanding and agreed to proceed.   History of Present Illness: This is a 48 year old man whom I saw in February for anal pain, suspected to be due to an anal fissure.  His symptoms at that time were not completely consistent with this process, as his pain did not occur with defecation but normally about 3 hours later.  He was too uncomfortable to examine at that time and I was not able to get a good rectal exam.  He was given a prescription for compounded nifedipine and lidocaine.  He says that he has been using this, but it has not helped.  He denies any bleeding or drainage.  He does report that he has what he calls a "bulging vein" adjacent to his anus now, that was not present previously.   Observations/Objective: As above, Bryan Cortez states that his pain has not resolved.  He is curious as to whether or not he has levator ani syndrome, as his symptoms are not completely consistent with a straightforward anal fissure.  He has been taking ibuprofen with some relief  Assessment and Plan: I had an extensive conversation with Bryan Cortez this morning.  We discussed the failure of outpatient conservative management in the setting of the COVID-19 pandemic and her inability to schedule elective procedures.  I expressed my sympathy for his situation and the fact that I am unable to do anything more aggressive for him at this time.  He was disappointed, but expressed his understanding.  Follow Up Instructions: I  reiterated my recommendations to him that he continue to employ sits baths, topical lidocaine, and ibuprofen, for local symptom relief.  Should the COVID-19 restrictions be lifted, he is certainly a patient that might benefit from an examination under anesthesia and either a block or Botox injection in the future.  He may also need to see colon and rectal surgery for further evaluation of levator ani syndrome.  For now, I will see him if possible, in about 6 weeks to reevaluate.   I discussed the assessment and treatment plan with the patient. The patient was provided an opportunity to ask questions and all were answered. The patient agreed with the plan and demonstrated an understanding of the instructions.   The patient was advised to call back or seek an in-person evaluation if the symptoms worsen or if the condition fails to improve as anticipated.  I provided 20 minutes of non-face-to-face time during this encounter.   [name], MD

## 2018-08-18 DIAGNOSIS — Z86018 Personal history of other benign neoplasm: Secondary | ICD-10-CM | POA: Diagnosis not present

## 2018-08-18 DIAGNOSIS — L578 Other skin changes due to chronic exposure to nonionizing radiation: Secondary | ICD-10-CM | POA: Diagnosis not present

## 2018-08-18 DIAGNOSIS — Z85828 Personal history of other malignant neoplasm of skin: Secondary | ICD-10-CM | POA: Diagnosis not present

## 2018-08-18 DIAGNOSIS — Z872 Personal history of diseases of the skin and subcutaneous tissue: Secondary | ICD-10-CM | POA: Diagnosis not present

## 2018-09-08 ENCOUNTER — Ambulatory Visit: Payer: Federal, State, Local not specified - PPO | Admitting: General Surgery

## 2018-09-10 ENCOUNTER — Encounter: Payer: Self-pay | Admitting: General Surgery

## 2018-09-10 ENCOUNTER — Ambulatory Visit (INDEPENDENT_AMBULATORY_CARE_PROVIDER_SITE_OTHER): Payer: Federal, State, Local not specified - PPO | Admitting: General Surgery

## 2018-09-10 ENCOUNTER — Other Ambulatory Visit: Payer: Self-pay

## 2018-09-10 ENCOUNTER — Encounter: Payer: Self-pay | Admitting: *Deleted

## 2018-09-10 VITALS — BP 162/88 | HR 63 | Temp 97.5°F | Resp 12 | Ht 72.0 in | Wt 224.0 lb

## 2018-09-10 DIAGNOSIS — K6289 Other specified diseases of anus and rectum: Secondary | ICD-10-CM | POA: Diagnosis not present

## 2018-09-10 DIAGNOSIS — G8929 Other chronic pain: Secondary | ICD-10-CM

## 2018-09-10 MED ORDER — HYDROCORTISONE ACETATE 25 MG RE SUPP: 25.0000 mg | Freq: Two times a day (BID) | RECTAL | 0 refills | Status: DC

## 2018-09-10 NOTE — Progress Notes (Signed)
Referral placed for [facility] Dr Michael Boston or Dr Leighton Ruff regarding possible Levator Ani Syndrome, notes, insurance card and demographics faxed.

## 2018-09-10 NOTE — Progress Notes (Signed)
Bryan Cortez is here today for follow-up of anal pain.  He is a 48 year old man whom I first saw in February of this year.  My initial consult note is partially copied here:  "He has been referred by his primary care doctor, Dr. Lavon Paganini, for evaluation of an anal fissure.  He states that he had 1 in his 92s that healed on its own.  He did well until the end of last summer.  He states that he was started on a new medication that caused him to have a lot of diarrhea.  This resulted in anal irritation.  He saw Dr. Brita Romp who diagnosed him with an anal fissure.  He started using Anusol and a nitroglycerin topical agent that he says worked really well.  He had been referred to see [name], however he canceled his appointment due to the improvement in his symptoms.  He states that recently, his symptoms have worsened again and he is interested in learning whether or not there might be any additional therapy options.  He states that his main symptom is that about 3 hours after he has a bowel movement, he experiences an aching pain in his anal rectal area.  It does not occur when he actually has a bowel movement.  The pain is neither sharp nor stabbing or tearing in nature.  He denies constipation and says that he has regular, soft, formed bowel movements."  At that time, we gave him a prescription for a topical blend of nifedipine and lidocaine, as an effort to correct this without more aggressive intervention, such as Botox injection or lateral internal sphincterotomy.  We had a telephone visit on April 8.  He had been using the topical agent along with hydrocortisone suppositories and topical nitroglycerin.  He reported to me during that conversation that he had not really had any relief of his pain.  He questioned whether or not he might have levator ani syndrome, as he does not have pain with defecation nor any bleeding, but rather experiences pain several hours following a bowel movement.  He is here today  for reevaluation.  He states that he has been diligently using both the topical and suppository agents that were prescribed.  He says that he has some relief, but if he stops or backs off using them at all, his pain recurs.  He is using sits baths frequently.  He denies constipation.  He continues to deny any pain with defecation.  He says that cutting bananas and corn chips out of his diet seems to have helped as well.  Past Medical History:  Diagnosis Date  . Hypertension   . Kidney stone    Past Surgical History:  Procedure Laterality Date  . TESTICLE SURGERY Bilateral 1974   undistended   Family History  Problem Relation Age of Onset  . Bladder Cancer Father   . Heart disease Father   . Cancer Father        bladder  . Dementia Father   . Kidney Stones Sister   . Obesity Sister   . Other Sister        prediabetes  . Cancer Mother        unknown CA  . Anxiety disorder Brother   . Kidney cancer Neg Hx   . Prostate cancer Neg Hx    Social History   Tobacco Use  . Smoking status: Never Smoker  . Smokeless tobacco: Never Used  Substance Use Topics  . Alcohol use: No  Comment: rarely  . Drug use: No   Current Meds  Medication Sig  . hydrochlorothiazide (HYDRODIURIL) 25 MG tablet Take 1 tablet (25 mg total) by mouth daily.  . hydrocortisone (ANUSOL-HC) 25 MG suppository Place 25 mg rectally 2 (two) times daily.  . Nitroglycerin 0.4 % OINT Place rectally.   Allergies  Allergen Reactions  . Naproxen Hives   Physical exam: Vitals:   09/10/18 0943  BP: (!) 162/88  Pulse: 63  Resp: 12  Temp: (!) 97.5 F (36.4 C)  SpO2: 98%   A chaperone was present.  Gen: A&O x 3. NAD HEENT: wearing mask due to COVID-19.  No scleral icterus, no proptosis or exophthalmos. Cardiovascular: Regular rate and rhythm Lungs: Normal work of breathing on room air Rectal exam: No external hemorrhoids, or visible fissure.  Sphincter tone is extremely tight, however he is able to tolerate  the exam, which he was not able to do in February.  There is no gross blood appreciated.  Impression and plan: This is a 48 year old man who has a history of an anal fissure that seemed to resolve with conservative measures.  He continues to experience severe anal pain, however it does not occur with defecation and is delayed by several hours.  He has had minimal response to conservative measures.  He may, in fact, have levator ani syndrome.  This is outside the scope of my practice.  We will place referral to colon and rectal surgery in [facility] for further evaluation.  I will see him on an as-needed basis.

## 2018-09-10 NOTE — Patient Instructions (Addendum)
The patient is aware to call back for any questions or new concerns. Colon rectal   Refer to colon rectal [facility] regarding Levator ani syndrome

## 2018-09-10 NOTE — Addendum Note (Signed)
Addended by: Carson Myrtle on: 09/10/2018 01:52 PM   Modules accepted: Orders

## 2018-09-29 DIAGNOSIS — N343 Urethral syndrome, unspecified: Secondary | ICD-10-CM | POA: Diagnosis not present

## 2018-10-02 ENCOUNTER — Encounter: Payer: Self-pay | Admitting: Family Medicine

## 2018-10-03 ENCOUNTER — Ambulatory Visit (INDEPENDENT_AMBULATORY_CARE_PROVIDER_SITE_OTHER): Payer: Federal, State, Local not specified - PPO | Admitting: Family Medicine

## 2018-10-03 ENCOUNTER — Encounter: Payer: Self-pay | Admitting: Family Medicine

## 2018-10-03 VITALS — BP 128/88

## 2018-10-03 DIAGNOSIS — I10 Essential (primary) hypertension: Secondary | ICD-10-CM | POA: Diagnosis not present

## 2018-10-03 NOTE — Progress Notes (Signed)
Patient: Bryan Cortez Male    DOB: 1970-07-21   48 y.o.   MRN: [id number] Visit Date: 10/03/2018  Today's Provider: Lavon Paganini, MD   Chief Complaint  Patient presents with  . Hypertension   Subjective:    I, Bryan Cortez Bryan Cortez, am acting as a scribe for Bryan Paganini, MD.   Virtual Visit via Video Note  I connected with Bryan Cortez on 10/03/18 at  8:20 AM EDT by a video enabled telemedicine application and verified that I am speaking with the correct person using two identifiers.   Patient location: work Secondary school teacher location: [place] involved in the visit: patient, provider   I discussed the limitations of evaluation and management by telemedicine and the availability of in person appointments. The patient expressed understanding and agreed to proceed.     HPI  Hypertension, follow-up:  BP Readings from Last 3 Encounters:  10/03/18 128/88  09/10/18 (!) 162/88  06/16/18 (!) 148/89    He was last seen for hypertension 6 months ago.  BP at that visit was 136/89. Management changes since that visit include . He reports good compliance with treatment. He is not having side effects.  He is exercising. He is adherent to low salt diet.   Outside blood pressures are this morning checked it twice: first reading 147/91 & second reading was 128/88. He is experiencing none.  Patient denies chest pain, chest pressure/discomfort, exertional chest pressure/discomfort, fatigue, irregular heart beat, lower extremity edema, palpitations and tachypnea.   Cardiovascular risk factors include hypertension and male gender.  Use of agents associated with hypertension: none.     Weight trend: stable Wt Readings from Last 3 Encounters:  09/10/18 224 lb (101.6 kg)  06/16/18 227 lb (103 kg)  04/03/18 228 lb 6.4 oz (103.6 kg)    Current diet: in general, a "healthy" diet     ------------------------------------------------------------------------  Still seeing surgery for anal fissure and chronic anal pain. Susposed to start pelvic floor PT.  Allergies  Allergen Reactions  . Naproxen Hives     Current Outpatient Medications:  .  hydrochlorothiazide (HYDRODIURIL) 25 MG tablet, Take 1 tablet (25 mg total) by mouth daily., Disp: 90 tablet, Rfl: 2 .  hydrocortisone (ANUSOL-HC) 25 MG suppository, Place 25 mg rectally 2 (two) times daily., Disp: , Rfl:  .  hydrocortisone (ANUSOL-HC) 25 MG suppository, Place 1 suppository (25 mg total) rectally 2 (two) times daily., Disp: 12 suppository, Rfl: 0 .  Nitroglycerin 0.4 % OINT, Place rectally., Disp: , Rfl:   Review of Systems  Constitutional: Negative.   Respiratory: Negative.   Genitourinary: Negative.   Hematological: Negative.     Social History   Tobacco Use  . Smoking status: Never Smoker  . Smokeless tobacco: Never Used  Substance Use Topics  . Alcohol use: No    Comment: rarely      Objective:   BP 128/88 (BP Location: Left Arm, Patient Position: Sitting, Cuff Size: Normal)  Vitals:   10/03/18 0815  BP: 128/88     Physical Exam Constitutional:      Appearance: Normal appearance.  Pulmonary:     Effort: Pulmonary effort is normal. No respiratory distress.  Neurological:     Mental Status: He is alert and oriented to person, place, and time. Mental status is at baseline.  Psychiatric:        Mood and Affect: Mood normal.        Behavior: Behavior normal.  Assessment & Plan   Follow Up Instructions: I discussed the assessment and treatment plan with the patient. The patient was provided an opportunity to ask questions and all were answered. The patient agreed with the plan and demonstrated an understanding of the instructions.   The patient was advised to call back or seek an in-person evaluation if the symptoms worsen or if the condition fails to improve as anticipated.    Problem List Items Addressed This Visit      Cardiovascular and Mediastinum   Hypertension - Primary    Well controlled Continue HCTZ 25mg  daily Recheck metabolic panel at CPE           Return in about 3 months (around 01/03/2019) for CPE.   The entirety of the information documented in the History of Present Illness, Review of Systems and Physical Exam were personally obtained by me. Portions of this information were initially documented by Bryan Cortez, Bryan Cortez and reviewed by me for thoroughness and accuracy.    Bryan Cortez, Bryan Bucy, MD MPH [facility]

## 2018-10-03 NOTE — Assessment & Plan Note (Signed)
Well controlled Continue HCTZ 25mg  daily Recheck metabolic panel at CPE

## 2018-11-11 DIAGNOSIS — R102 Pelvic and perineal pain: Secondary | ICD-10-CM | POA: Diagnosis not present

## 2018-11-11 DIAGNOSIS — M6281 Muscle weakness (generalized): Secondary | ICD-10-CM | POA: Diagnosis not present

## 2018-12-21 ENCOUNTER — Emergency Department
Admission: EM | Admit: 2018-12-21 | Discharge: 2018-12-21 | Disposition: A | Discharge status: Home / Self Care | Payer: Federal, State, Local not specified - PPO | Attending: Student in an Organized Health Care Education/Training Program | Admitting: Student in an Organized Health Care Education/Training Program

## 2018-12-21 ENCOUNTER — Other Ambulatory Visit: Payer: Self-pay

## 2018-12-21 ENCOUNTER — Encounter: Payer: Self-pay | Admitting: Emergency Medicine

## 2018-12-21 DIAGNOSIS — Y939 Activity, unspecified: Secondary | ICD-10-CM | POA: Diagnosis not present

## 2018-12-21 DIAGNOSIS — S60351A Superficial foreign body of right thumb, initial encounter: Secondary | ICD-10-CM

## 2018-12-21 DIAGNOSIS — Y999 Unspecified external cause status: Secondary | ICD-10-CM | POA: Diagnosis not present

## 2018-12-21 DIAGNOSIS — W458XXA Other foreign body or object entering through skin, initial encounter: Secondary | ICD-10-CM | POA: Insufficient documentation

## 2018-12-21 DIAGNOSIS — Y929 Unspecified place or not applicable: Secondary | ICD-10-CM | POA: Insufficient documentation

## 2018-12-21 DIAGNOSIS — S61031A Puncture wound without foreign body of right thumb without damage to nail, initial encounter: Secondary | ICD-10-CM | POA: Insufficient documentation

## 2018-12-21 MED ORDER — LIDOCAINE HCL (PF) 1 % IJ SOLN
INTRAMUSCULAR | Status: AC
  Administered 2018-12-21: 14:00:00
  Filled 2018-12-21: qty 5

## 2018-12-21 NOTE — ED Notes (Signed)
Pt has fish hook stuck in right thumb. Hook has barb. Tetanus shot in 2015. No redness noted.

## 2018-12-21 NOTE — ED Triage Notes (Signed)
Fish hook in right thumb, last tetanus in 2015

## 2018-12-21 NOTE — ED Provider Notes (Signed)
[facility] Emergency Department Provider Note   ____________________________________________   First MD Initiated Contact with Patient 12/21/18 1257     (approximate)  I have reviewed the triage vital signs and the nursing notes.   HISTORY  Chief Complaint Foreign Body in Skin    HPI Bryan Cortez is a 48 y.o. male patient presents with fish hook embedded in her right thumb.  Incident occurred prior to arrival.  Patient states tetanus status up-to-date.  Patient rates pain as a 3/10.  Patient denies loss sensation or loss of function.  Patient is left-hand dominant.         Past Medical History:  Diagnosis Date  . Hypertension   . Kidney stone     Patient Active Problem List   Diagnosis Date Noted  . Chronic idiopathic anal pain 09/10/2018  . Anal fissure 01/02/2018  . Hypertension 12/12/2016  . Obesity 12/12/2016  . Allergic rhinitis 11/24/2015  . Frequent headaches 11/24/2015    Past Surgical History:  Procedure Laterality Date  . TESTICLE SURGERY Bilateral 1974   undistended    Prior to Admission medications   Medication Sig Start Date End Date Taking? Authorizing Provider  hydrochlorothiazide (HYDRODIURIL) 25 MG tablet Take 1 tablet (25 mg total) by mouth daily. 05/27/18   Virginia Crews, MD  hydrocortisone (ANUSOL-HC) 25 MG suppository Place 25 mg rectally 2 (two) times daily.    [provider]  hydrocortisone (ANUSOL-HC) 25 MG suppository Place 1 suppository (25 mg total) rectally 2 (two) times daily. 09/10/18   [name], MD  Nitroglycerin 0.4 % OINT Place rectally.    [provider]    Allergies Naproxen  Family History  Problem Relation Age of Onset  . Bladder Cancer Father   . Heart disease Father   . Cancer Father        bladder  . Dementia Father   . Kidney Stones Sister   . Obesity Sister   . Other Sister        prediabetes  . Cancer Mother        unknown CA  . Anxiety  disorder Brother   . Kidney cancer Neg Hx   . Prostate cancer Neg Hx     Social History Social History   Tobacco Use  . Smoking status: Never Smoker  . Smokeless tobacco: Never Used  Substance Use Topics  . Alcohol use: No    Comment: rarely  . Drug use: No    Review of Systems Constitutional: No fever/chills Eyes: No visual changes. ENT: No sore throat. Cardiovascular: Denies chest pain. Respiratory: Denies shortness of breath. Gastrointestinal: No abdominal pain.  No nausea, no vomiting.  No diarrhea.  No constipation. Genitourinary: Negative for dysuria. Musculoskeletal: Negative for back pain. Skin: Negative for rash.  Foreign body right thumb. Neurological: Negative for headaches, focal weakness or numbness. Allergic/Immunilogical: Naproxen.  ____________________________________________   PHYSICAL EXAM:  VITAL SIGNS: ED Triage Vitals  Enc Vitals Group     BP 12/21/18 1209 (!) 152/98     Pulse Rate 12/21/18 1209 61     Resp 12/21/18 1209 18     Temp 12/21/18 1209 99.4 F (37.4 C)     Temp Source 12/21/18 1209 Oral     SpO2 12/21/18 1209 94 %     Weight 12/21/18 1213 220 lb (99.8 kg)     Height 12/21/18 1213 6' (1.829 m)     Head Circumference --      Peak  Flow --      Pain Score 12/21/18 1213 0     Pain Loc --      Pain Edu? --      Excl. in [place]? --     Constitutional: Alert and oriented. Well appearing and in no acute distress. Cardiovascular: Normal rate, regular rhythm. Grossly normal heart sounds.  Good peripheral circulation. Respiratory: Normal respiratory effort.  No retractions. Lungs CTAB. Neurologic:  Normal speech and language. No gross focal neurologic deficits are appreciated. No gait instability. Skin:  Skin is warm, dry and intact. No rash noted.  Large fishhook embedded into her right thumb. Psychiatric: Mood and affect are normal. Speech and behavior are normal.  ____________________________________________   LABS (all labs ordered  are listed, but only abnormal results are displayed)  Labs Reviewed - No data to display ____________________________________________  EKG   ____________________________________________  RADIOLOGY  ED MD interpretation:    Official radiology report(s): No results found.  ____________________________________________   PROCEDURES  Procedure(s) performed (including Critical Care):  .Foreign Body Removal  Date/Time: 12/21/2018 3:30 PM Performed by: Sable Feil, PA-C Authorized by: Sable Feil, PA-C  Consent: Verbal consent obtained. Consent given by: patient Patient understanding: patient states understanding of the procedure being performed Patient consent: the patient's understanding of the procedure matches consent given Procedure consent: procedure consent matches procedure scheduled Patient identity confirmed: verbally with patient Body area: skin General location: upper extremity Location details: right thumb Anesthesia: digital block  Anesthesia: Local Anesthetic: lidocaine 1% without epinephrine  Sedation: Patient sedated: no  Patient restrained: no Complexity: simple Post-procedure assessment: foreign body removed Patient tolerance: patient tolerated the procedure well with no immediate complications     ____________________________________________   INITIAL IMPRESSION / ASSESSMENT AND PLAN / ED COURSE  As part of my medical decision making, I reviewed the following data within the [place] was evaluated in Emergency Department on 12/21/2018 for the symptoms described in the history of present illness. He was evaluated in the context of the global COVID-19 pandemic, which necessitated consideration that the patient might be at risk for infection with the SARS-CoV-2 virus that causes COVID-19. Institutional protocols and algorithms that pertain to the evaluation of patients at risk for COVID-19 are in  a state of rapid change based on information released by regulatory bodies including the CDC and federal and state organizations. These policies and algorithms were followed during the patient's care in the ED.  Patient presents with fishhook embedded in the right thumb.  See procedure note for foreign body removal.  Patient given discharge care instructions about follow-up PCP.      ____________________________________________   FINAL CLINICAL IMPRESSION(S) / ED DIAGNOSES  Final diagnoses:  Foreign body of right thumb, initial encounter     ED Discharge Orders    None       Note:  This document was prepared using Dragon voice recognition software and may include unintentional dictation errors.    Sable Feil, PA-C 12/21/18 1532    Merlyn Lot, MD 12/21/18 1535

## 2019-01-12 NOTE — Patient Instructions (Signed)
Preventive Care 40-50 Years Old, Male Preventive care refers to lifestyle choices and visits with your health care provider that can promote health and wellness. This includes:  A yearly physical exam. This is also called an annual well check.  Regular dental and eye exams.  Immunizations.  Screening for certain conditions.  Healthy lifestyle choices, such as eating a healthy diet, getting regular exercise, not using drugs or products that contain nicotine and tobacco, and limiting alcohol use. What can I expect for my preventive care visit? Physical exam Your health care provider will check:  Height and weight. These may be used to calculate body mass index (BMI), which is a measurement that tells if you are at a healthy weight.  Heart rate and blood pressure.  Your skin for abnormal spots. Counseling Your health care provider may ask you questions about:  Alcohol, tobacco, and drug use.  Emotional well-being.  Home and relationship well-being.  Sexual activity.  Eating habits.  Work and work environment. What immunizations do I need?  Influenza (flu) vaccine  This is recommended every year. Tetanus, diphtheria, and pertussis (Tdap) vaccine  You may need a Td booster every 10 years. Varicella (chickenpox) vaccine  You may need this vaccine if you have not already been vaccinated. Zoster (shingles) vaccine  You may need this after age 60. Measles, mumps, and rubella (MMR) vaccine  You may need at least one dose of MMR if you were born in 1957 or later. You may also need a second dose. Pneumococcal conjugate (PCV13) vaccine  You may need this if you have certain conditions and were not previously vaccinated. Pneumococcal polysaccharide (PPSV23) vaccine  You may need one or two doses if you smoke cigarettes or if you have certain conditions. Meningococcal conjugate (MenACWY) vaccine  You may need this if you have certain conditions. Hepatitis A vaccine   You may need this if you have certain conditions or if you travel or work in places where you may be exposed to hepatitis A. Hepatitis B vaccine  You may need this if you have certain conditions or if you travel or work in places where you may be exposed to hepatitis B. Haemophilus influenzae type b (Hib) vaccine  You may need this if you have certain risk factors. Human papillomavirus (HPV) vaccine  If recommended by your health care provider, you may need three doses over 6 months. You may receive vaccines as individual doses or as more than one vaccine together in one shot (combination vaccines). Talk with your health care provider about the risks and benefits of combination vaccines. What tests do I need? Blood tests  Lipid and cholesterol levels. These may be checked every 5 years, or more frequently if you are over 50 years old.  Hepatitis C test.  Hepatitis B test. Screening  Lung cancer screening. You may have this screening every year starting at age 55 if you have a 30-pack-year history of smoking and currently smoke or have quit within the past 15 years.  Prostate cancer screening. Recommendations will vary depending on your family history and other risks.  Colorectal cancer screening. All adults should have this screening starting at age 50 and continuing until age 75. Your health care provider may recommend screening at age 45 if you are at increased risk. You will have tests every 1-10 years, depending on your results and the type of screening test.  Diabetes screening. This is done by checking your blood sugar (glucose) after you have not eaten   for a while (fasting). You may have this done every 1-3 years.  Sexually transmitted disease (STD) testing. Follow these instructions at home: Eating and drinking  Eat a diet that includes fresh fruits and vegetables, whole grains, lean protein, and low-fat dairy products.  Take vitamin and mineral supplements as recommended  by your health care provider.  Do not drink alcohol if your health care provider tells you not to drink.  If you drink alcohol: ? Limit how much you have to 0-2 drinks a day. ? Be aware of how much alcohol is in your drink. In the [place] one drink equals one 12 oz bottle of beer (355 mL), one 5 oz glass of wine (148 mL), or one 1 oz glass of hard liquor (44 mL). Lifestyle  Take daily care of your teeth and gums.  Stay active. Exercise for at least 30 minutes on 5 or more days each week.  Do not use any products that contain nicotine or tobacco, such as cigarettes, e-cigarettes, and chewing tobacco. If you need help quitting, ask your health care provider.  If you are sexually active, practice safe sex. Use a condom or other form of protection to prevent STIs (sexually transmitted infections).  Talk with your health care provider about taking a low-dose aspirin every day starting at age 33. What's next?  Go to your health care provider once a year for a well check visit.  Ask your health care provider how often you should have your eyes and teeth checked.  Stay up to date on all vaccines. This information is not intended to replace advice given to you by your health care provider. Make sure you discuss any questions you have with your health care provider. Document Released: 05/13/2015 Document Revised: 04/10/2018 Document Reviewed: 04/10/2018 Elsevier Patient Education  2020 [employer].

## 2019-01-13 ENCOUNTER — Encounter: Payer: Self-pay | Admitting: Family Medicine

## 2019-01-13 ENCOUNTER — Other Ambulatory Visit: Payer: Self-pay

## 2019-01-13 ENCOUNTER — Ambulatory Visit: Payer: Federal, State, Local not specified - PPO | Admitting: Family Medicine

## 2019-01-13 VITALS — BP 146/88 | HR 56 | Temp 97.1°F | Wt 224.2 lb

## 2019-01-13 DIAGNOSIS — Z23 Encounter for immunization: Secondary | ICD-10-CM | POA: Diagnosis not present

## 2019-01-13 DIAGNOSIS — E669 Obesity, unspecified: Secondary | ICD-10-CM

## 2019-01-13 DIAGNOSIS — I83812 Varicose veins of left lower extremities with pain: Secondary | ICD-10-CM

## 2019-01-13 DIAGNOSIS — Z0000 Encounter for general adult medical examination without abnormal findings: Secondary | ICD-10-CM

## 2019-01-13 DIAGNOSIS — Z1211 Encounter for screening for malignant neoplasm of colon: Secondary | ICD-10-CM | POA: Diagnosis not present

## 2019-01-13 DIAGNOSIS — I10 Essential (primary) hypertension: Secondary | ICD-10-CM

## 2019-01-13 DIAGNOSIS — Z6830 Body mass index (BMI) 30.0-30.9, adult: Secondary | ICD-10-CM

## 2019-01-13 HISTORY — DX: Varicose veins of left lower extremity with pain: I83.812

## 2019-01-13 MED ORDER — LISINOPRIL 10 MG PO TABS: 10.0000 mg | ORAL_TABLET | Freq: Every day | ORAL | 3 refills | Status: DC

## 2019-01-13 MED ORDER — LISINOPRIL 20 MG PO TABS: 20.0000 mg | ORAL_TABLET | Freq: Every day | ORAL | 3 refills | Status: DC

## 2019-01-13 NOTE — Assessment & Plan Note (Signed)
Uncontrolled Continue HCTZ at current dose Add lisinopril 20 mg daily Recheck metabolic panel Discussed possible side effects Follow-up in 1 month

## 2019-01-13 NOTE — Progress Notes (Signed)
Patient: Bryan Cortez, Male    DOB: 1970-10-21, 48 y.o.   MRN: [id number] Visit Date: 01/13/2019  Today's Provider: Lavon Paganini, MD   Chief Complaint  Patient presents with  . Annual Exam   Subjective:    I, Tiburcio Pea, CMA, am acting as a scribe for Lavon Paganini, MD.    Annual physical exam Bryan Cortez is a 48 y.o. male who presents today for health maintenance and complete physical. He feels well. He reports exercising with active job, but no formal exercise. He reports he is sleeping well.  ----------------------------------------------------------------- Varicose veins getting larger and more painful over last several months.  No edema.    HTN: - Medications: HCTZ 25mg  daily - Compliance: good - Checking BP at home: no - Denies any SOB, CP, vision changes, LE edema, medication SEs, or symptoms of hypotension   Review of Systems  Constitutional: Negative.   HENT: Negative.   Eyes: Negative.   Respiratory: Negative.   Cardiovascular: Negative.   Gastrointestinal: Negative.   Endocrine: Negative.   Genitourinary: Negative.   Musculoskeletal: Negative.   Skin: Negative.   Allergic/Immunologic: Negative.   Neurological: Negative.   Hematological: Negative.   Psychiatric/Behavioral: Negative.     Social History      He  reports that he has never smoked. He has never used smokeless tobacco. He reports that he does not drink alcohol or use drugs.       Social History   Socioeconomic History  . Marital status: Married    Spouse name: Pritesh Auringer  . Number of children: 2  . Years of education: some college  . Highest education level: Not on file  Occupational History    Employer: [name]  Social Needs  . Financial resource strain: Not on file  . Food insecurity    Worry: Not on file    Inability: Not on file  . Transportation needs    Medical: Not on file    Non-medical: Not on file  Tobacco Use  . Smoking status: Never Smoker   . Smokeless tobacco: Never Used  Substance and Sexual Activity  . Alcohol use: No    Comment: rarely  . Drug use: No  . Sexual activity: Yes  Lifestyle  . Physical activity    Days per week: Not on file    Minutes per session: Not on file  . Stress: Not on file  Relationships  . Social Herbalist on phone: Not on file    Gets together: Not on file    Attends religious service: Not on file    Active member of club or organization: Not on file    Attends meetings of clubs or organizations: Not on file    Relationship status: Not on file  Other Topics Concern  . Not on file  Social History Narrative  . Not on file    Past Medical History:  Diagnosis Date  . Hypertension   . Kidney stone      Patient Active Problem List   Diagnosis Date Noted  . Varicose veins of left lower extremity with pain 01/13/2019  . Chronic idiopathic anal pain 09/10/2018  . Anal fissure 01/02/2018  . Hypertension 12/12/2016  . Obesity 12/12/2016  . Allergic rhinitis 11/24/2015  . Frequent headaches 11/24/2015    Past Surgical History:  Procedure Laterality Date  . TESTICLE SURGERY Bilateral 1974   undistended    Family History  Family Status  Relation Name Status  . Father  Deceased  . Sister  Alive  . Mother  Deceased  . Brother  Alive  . Neg Hx  (Not Specified)        His family history includes Anxiety disorder in his brother; Bladder Cancer in his father; Cancer in his father and mother; Dementia in his father; Heart disease in his father; Kidney Stones in his sister; Obesity in his sister; Other in his sister. There is no history of Kidney cancer or Prostate cancer.      Allergies  Allergen Reactions  . Naproxen Hives     Current Outpatient Medications:  .  hydrochlorothiazide (HYDRODIURIL) 25 MG tablet, Take 1 tablet (25 mg total) by mouth daily., Disp: 90 tablet, Rfl: 2 .  Nitroglycerin 0.4 % OINT, Place rectally., Disp: , Rfl:  .  lisinopril  (ZESTRIL) 20 MG tablet, Take 1 tablet (20 mg total) by mouth daily., Disp: 30 tablet, Rfl: 3   Patient Care Team: Virginia Crews, MD as PCP - General (Family Medicine)    Objective:    Vitals: BP (!) 146/88 (BP Location: Right Arm, Patient Position: Sitting, Cuff Size: Large)   Pulse (!) 56   Temp (!) 97.1 F (36.2 C) (Temporal)   Wt 224 lb 3.2 oz (101.7 kg)   SpO2 99%   BMI 30.41 kg/m    Vitals:   01/13/19 1110  BP: (!) 146/88  Pulse: (!) 56  Temp: (!) 97.1 F (36.2 C)  TempSrc: Temporal  SpO2: 99%  Weight: 224 lb 3.2 oz (101.7 kg)     Physical Exam Vitals signs reviewed.  Constitutional:      General: He is not in acute distress.    Appearance: Normal appearance. He is well-developed. He is not diaphoretic.  HENT:     Head: Normocephalic and atraumatic.     Right Ear: Tympanic membrane, ear canal and external ear normal.     Left Ear: Tympanic membrane, ear canal and external ear normal.  Eyes:     General: No scleral icterus.    Conjunctiva/sclera: Conjunctivae normal.     Pupils: Pupils are equal, round, and reactive to light.  Neck:     Musculoskeletal: Neck supple.     Thyroid: No thyromegaly.  Cardiovascular:     Rate and Rhythm: Normal rate and regular rhythm.     Pulses: Normal pulses.     Heart sounds: Normal heart sounds. No murmur.  Pulmonary:     Effort: Pulmonary effort is normal. No respiratory distress.     Breath sounds: Normal breath sounds. No wheezing or rales.  Abdominal:     General: There is no distension.     Palpations: Abdomen is soft.     Tenderness: There is no abdominal tenderness.  Musculoskeletal:        General: No deformity.     Right lower leg: No edema.     Left lower leg: No edema.     Comments: +varicose veins of L leg  Lymphadenopathy:     Cervical: No cervical adenopathy.  Skin:    General: Skin is warm and dry.     Capillary Refill: Capillary refill takes less than 2 seconds.     Findings: No rash.   Neurological:     Mental Status: He is alert and oriented to person, place, and time. Mental status is at baseline.  Psychiatric:        Mood and Affect: Mood normal.  Behavior: Behavior normal.        Thought Content: Thought content normal.      Depression Screen PHQ 2/9 Scores 01/13/2019 01/02/2018 12/12/2016  PHQ - 2 Score 0 1 0  PHQ- 9 Score 0 2 -      Assessment & Plan:     Routine Health Maintenance and Physical Exam  Exercise Activities and Dietary recommendations Goals   None     Immunization History  Administered Date(s) Administered  . Influenza,inj,Quad PF,6+ Mos 01/02/2018, 01/13/2019  . Tdap 12/03/2013    Health Maintenance  Topic Date Due  . COLONOSCOPY  11/06/2018  . TETANUS/TDAP  12/04/2023  . INFLUENZA VACCINE  Completed  . HIV Screening  Completed     Discussed health benefits of physical activity, and encouraged him to engage in regular exercise appropriate for his age and condition.    --------------------------------------------------------------------  Problem List Items Addressed This Visit      Cardiovascular and Mediastinum   Hypertension    Uncontrolled Continue HCTZ at current dose Add lisinopril 20 mg daily Recheck metabolic panel Discussed possible side effects Follow-up in 1 month      Relevant Medications   lisinopril (ZESTRIL) 20 MG tablet   Other Relevant Orders   Comprehensive metabolic panel   Lipid panel   Varicose veins of left lower extremity with pain    Longstanding problem Now becoming more symptomatic with discomfort Referral to vascular surgery for further evaluation and management Did discuss importance of compression even without edema      Relevant Medications   lisinopril (ZESTRIL) 20 MG tablet   Other Relevant Orders   Ambulatory referral to Vascular Surgery     Other   Obesity    Discussed importance of healthy weight management Discussed diet and exercise       Relevant Orders    Comprehensive metabolic panel   Lipid panel    Other Visit Diagnoses    Encounter for annual physical exam    -  Primary   Relevant Orders   Comprehensive metabolic panel   Lipid panel   Screen for colon cancer       Relevant Orders   Ambulatory referral to Gastroenterology       Return in about 4 weeks (around 02/10/2019) for BP f/u.   The entirety of the information documented in the History of Present Illness, Review of Systems and Physical Exam were personally obtained by me. Portions of this information were initially documented by Tiburcio Pea, CMA and reviewed by me for thoroughness and accuracy.    Maryna Yeagle, Dionne Bucy, MD MPH [facility]

## 2019-01-13 NOTE — Assessment & Plan Note (Signed)
Longstanding problem Now becoming more symptomatic with discomfort Referral to vascular surgery for further evaluation and management Did discuss importance of compression even without edema

## 2019-01-13 NOTE — Assessment & Plan Note (Signed)
Discussed importance of healthy weight management Discussed diet and exercise  

## 2019-01-14 ENCOUNTER — Telehealth: Payer: Self-pay

## 2019-01-14 LAB — LIPID PANEL
Chol/HDL Ratio: 3.8 ratio (ref 0.0–5.0)
Cholesterol, Total: 190 mg/dL (ref 100–199)
HDL: 50 mg/dL (ref >39)
LDL Chol Calc (NIH): 131 mg/dL — ABNORMAL HIGH (ref 0–99)
Triglycerides: 45 mg/dL (ref 0–149)
VLDL Cholesterol Cal: 9 mg/dL (ref 5–40)

## 2019-01-14 LAB — COMPREHENSIVE METABOLIC PANEL
ALT: 26 IU/L (ref 0–44)
AST: 22 IU/L (ref 0–40)
Albumin/Globulin Ratio: 1.9 (ref 1.2–2.2)
Albumin: 4.6 g/dL (ref 4.0–5.0)
Alkaline Phosphatase: 63 IU/L (ref 39–117)
BUN/Creatinine Ratio: 13 (ref 9–20)
BUN: 15 mg/dL (ref 6–24)
Bilirubin Total: 0.9 mg/dL (ref 0.0–1.2)
CO2: 25 mmol/L (ref 20–29)
Calcium: 9.2 mg/dL (ref 8.7–10.2)
Chloride: 102 mmol/L (ref 96–106)
Creatinine, Ser: 1.16 mg/dL (ref 0.76–1.27)
GFR calc Af Amer: 84 mL/min/{1.73_m2} (ref >59)
GFR calc non Af Amer: 73 mL/min/{1.73_m2} (ref >59)
Globulin, Total: 2.4 g/dL (ref 1.5–4.5)
Glucose: 89 mg/dL (ref 65–99)
Potassium: 4.1 mmol/L (ref 3.5–5.2)
Sodium: 140 mmol/L (ref 134–144)
Total Protein: 7 g/dL (ref 6.0–8.5)

## 2019-01-14 NOTE — Telephone Encounter (Signed)
-----   Message from Virginia Crews, MD sent at 01/14/2019  1:18 PM EDT ----- Normal labs.  Cholesterol is increasing over the last 2 years, but 10 year risk of heart disease/stroke is low at 3.4%.  No need for medications, but I recommend diet low in saturated fat and regular exercise - 30 min at least 5 times per week

## 2019-01-14 NOTE — Telephone Encounter (Signed)
Patient was advised.  

## 2019-01-16 ENCOUNTER — Other Ambulatory Visit: Payer: Self-pay

## 2019-01-16 ENCOUNTER — Telehealth: Payer: Self-pay

## 2019-01-16 DIAGNOSIS — Z1211 Encounter for screening for malignant neoplasm of colon: Secondary | ICD-10-CM

## 2019-01-16 MED ORDER — NA SULFATE-K SULFATE-MG SULF 17.5-3.13-1.6 GM/177ML PO SOLN: 1.0000 | Freq: Once | ORAL | 0 refills | Status: AC

## 2019-01-16 NOTE — Telephone Encounter (Signed)
Gastroenterology Pre-Procedure Review  Request Date: 01/23/19 Requesting Physician: Dr. Allen Norris  PATIENT REVIEW QUESTIONS: The patient responded to the following health history questions as indicated:    1. Are you having any GI issues? no 2. Do you have a personal history of Polyps? no 3. Do you have a family history of Colon Cancer or Polyps? no 4. Diabetes Mellitus? no 5. Joint replacements in the past 12 months?no 6. Major health problems in the past 3 months?no 7. Any artificial heart valves, MVP, or defibrillator?no    MEDICATIONS & ALLERGIES:    Patient reports the following regarding taking any anticoagulation/antiplatelet therapy:   Plavix, Coumadin, Eliquis, Xarelto, Lovenox, Pradaxa, Brilinta, or Effient? no Aspirin? no  Patient confirms/reports the following medications:  Current Outpatient Medications  Medication Sig Dispense Refill  . hydrochlorothiazide (HYDRODIURIL) 25 MG tablet Take 1 tablet (25 mg total) by mouth daily. 90 tablet 2  . lisinopril (ZESTRIL) 20 MG tablet Take 1 tablet (20 mg total) by mouth daily. 30 tablet 3  . Nitroglycerin 0.4 % OINT Place rectally.     No current facility-administered medications for this visit.     Patient confirms/reports the following allergies:  Allergies  Allergen Reactions  . Naproxen Hives    No orders of the defined types were placed in this encounter.   AUTHORIZATION INFORMATION Primary Insurance: 1D#: Group #:  Secondary Insurance: 1D#: Group #:  SCHEDULE INFORMATION: Date: 01/23/19 Time: Location:[facility]

## 2019-01-20 ENCOUNTER — Other Ambulatory Visit: Payer: Self-pay

## 2019-01-20 ENCOUNTER — Other Ambulatory Visit
Admission: RE | Admit: 2019-01-20 | Discharge: 2019-01-20 | Disposition: A | Discharge status: Home / Self Care | Payer: Federal, State, Local not specified - PPO | Source: Ambulatory Visit | Attending: Gastroenterology | Admitting: Gastroenterology

## 2019-01-20 ENCOUNTER — Encounter: Payer: Self-pay | Admitting: *Deleted

## 2019-01-20 DIAGNOSIS — Z20828 Contact with and (suspected) exposure to other viral communicable diseases: Secondary | ICD-10-CM | POA: Insufficient documentation

## 2019-01-20 DIAGNOSIS — Z01812 Encounter for preprocedural laboratory examination: Secondary | ICD-10-CM | POA: Diagnosis not present

## 2019-01-21 LAB — SARS CORONAVIRUS 2 (TAT 6-24 HRS): SARS Coronavirus 2: NEGATIVE

## 2019-01-21 NOTE — Anesthesia Preprocedure Evaluation (Addendum)
Anesthesia Evaluation  Patient identified by MRN, date of birth, ID band Patient awake    Reviewed: Allergy & Precautions, NPO status , Patient's Chart, lab work & pertinent test results  History of Anesthesia Complications Negative for: history of anesthetic complications  Airway Mallampati: I   Neck ROM: Full    Dental   Upper bridge:   Pulmonary neg pulmonary ROS,    Pulmonary exam normal breath sounds clear to auscultation       Cardiovascular hypertension, Normal cardiovascular exam Rhythm:Regular Rate:Normal     Neuro/Psych negative neurological ROS     GI/Hepatic negative GI ROS,   Endo/Other  negative endocrine ROS  Renal/GU Renal disease (nephrolithiasis)     Musculoskeletal  (+) Arthritis ,   Abdominal   Peds  Hematology negative hematology ROS (+)   Anesthesia Other Findings   Reproductive/Obstetrics                            Anesthesia Physical Anesthesia Plan  ASA: II  Anesthesia Plan: General   Post-op Pain Management:    Induction: Intravenous  PONV Risk Score and Plan: 2 and Propofol infusion and TIVA  Airway Management Planned: Natural Airway  Additional Equipment:   Intra-op Plan:   Post-operative Plan:   Informed Consent: I have reviewed the patients History and Physical, chart, labs and discussed the procedure including the risks, benefits and alternatives for the proposed anesthesia with the patient or authorized representative who has indicated his/her understanding and acceptance.       Plan Discussed with: CRNA  Anesthesia Plan Comments:        Anesthesia Quick Evaluation

## 2019-01-22 ENCOUNTER — Telehealth: Payer: Self-pay | Admitting: Gastroenterology

## 2019-01-22 NOTE — Telephone Encounter (Signed)
Patient was scheduled for his colonoscopy 01/16/19.  His instructions were sent to him via mychart after he was scheduled.  Returned patients call LVM advising him to please give the office a call back and I will be happy to reschedule his colonoscopy 2 weeks out to allow plenty of time for the instructions to get to him since he did not see them in his mychart and the mail.    Thanks [employer]

## 2019-01-22 NOTE — Telephone Encounter (Signed)
Pt left vm he never received his instructions for his procedure tomorrow and he is not sure what he should and should not eat please call pt with instructions

## 2019-01-23 ENCOUNTER — Encounter
Admission: RE | Disposition: A | Discharge status: Home / Self Care | Payer: Self-pay | Source: Home / Self Care | Attending: Gastroenterology

## 2019-01-23 ENCOUNTER — Ambulatory Visit: Payer: Federal, State, Local not specified - PPO | Admitting: Anesthesiology

## 2019-01-23 ENCOUNTER — Ambulatory Visit
Admission: RE | Admit: 2019-01-23 | Discharge: 2019-01-23 | Disposition: A | Discharge status: Home / Self Care | Payer: Federal, State, Local not specified - PPO | Attending: Gastroenterology | Admitting: Gastroenterology

## 2019-01-23 ENCOUNTER — Other Ambulatory Visit: Payer: Self-pay

## 2019-01-23 DIAGNOSIS — Z8249 Family history of ischemic heart disease and other diseases of the circulatory system: Secondary | ICD-10-CM | POA: Diagnosis not present

## 2019-01-23 DIAGNOSIS — Z820 Family history of epilepsy and other diseases of the nervous system: Secondary | ICD-10-CM | POA: Insufficient documentation

## 2019-01-23 DIAGNOSIS — D123 Benign neoplasm of transverse colon: Secondary | ICD-10-CM | POA: Insufficient documentation

## 2019-01-23 DIAGNOSIS — Z888 Allergy status to other drugs, medicaments and biological substances status: Secondary | ICD-10-CM | POA: Insufficient documentation

## 2019-01-23 DIAGNOSIS — Z79899 Other long term (current) drug therapy: Secondary | ICD-10-CM | POA: Insufficient documentation

## 2019-01-23 DIAGNOSIS — Z87442 Personal history of urinary calculi: Secondary | ICD-10-CM | POA: Insufficient documentation

## 2019-01-23 DIAGNOSIS — I10 Essential (primary) hypertension: Secondary | ICD-10-CM | POA: Insufficient documentation

## 2019-01-23 DIAGNOSIS — Z8489 Family history of other specified conditions: Secondary | ICD-10-CM | POA: Insufficient documentation

## 2019-01-23 DIAGNOSIS — Z841 Family history of disorders of kidney and ureter: Secondary | ICD-10-CM | POA: Insufficient documentation

## 2019-01-23 DIAGNOSIS — Z818 Family history of other mental and behavioral disorders: Secondary | ICD-10-CM | POA: Insufficient documentation

## 2019-01-23 DIAGNOSIS — Z1211 Encounter for screening for malignant neoplasm of colon: Secondary | ICD-10-CM | POA: Diagnosis not present

## 2019-01-23 DIAGNOSIS — K635 Polyp of colon: Secondary | ICD-10-CM

## 2019-01-23 DIAGNOSIS — Z8052 Family history of malignant neoplasm of bladder: Secondary | ICD-10-CM | POA: Insufficient documentation

## 2019-01-23 DIAGNOSIS — D122 Benign neoplasm of ascending colon: Secondary | ICD-10-CM | POA: Diagnosis not present

## 2019-01-23 DIAGNOSIS — K640 First degree hemorrhoids: Secondary | ICD-10-CM | POA: Diagnosis not present

## 2019-01-23 DIAGNOSIS — M19071 Primary osteoarthritis, right ankle and foot: Secondary | ICD-10-CM | POA: Diagnosis not present

## 2019-01-23 HISTORY — DX: Presence of dental prosthetic device (complete) (partial): Z97.2

## 2019-01-23 HISTORY — PX: COLONOSCOPY WITH PROPOFOL: SHX5780

## 2019-01-23 HISTORY — DX: Motion sickness, initial encounter: T75.3XXA

## 2019-01-23 HISTORY — PX: POLYPECTOMY: SHX5525

## 2019-01-23 HISTORY — DX: Unspecified osteoarthritis, unspecified site: M19.90

## 2019-01-23 SURGERY — COLONOSCOPY WITH PROPOFOL
Anesthesia: General | Site: Rectum

## 2019-01-23 MED ORDER — ACETAMINOPHEN 160 MG/5ML PO SOLN: 325.0000 mg | ORAL | Status: DC | PRN

## 2019-01-23 MED ORDER — LIDOCAINE HCL (CARDIAC) PF 100 MG/5ML IV SOSY
PREFILLED_SYRINGE | INTRAVENOUS | Status: DC | PRN
  Administered 2019-01-23: 30 mg via INTRAVENOUS

## 2019-01-23 MED ORDER — PROPOFOL 10 MG/ML IV BOLUS
INTRAVENOUS | Status: DC | PRN
  Administered 2019-01-23: 100 mg via INTRAVENOUS
  Administered 2019-01-23 (×4): 50 mg via INTRAVENOUS

## 2019-01-23 MED ORDER — LACTATED RINGERS IV SOLN
10.0000 mL/h | INTRAVENOUS | Status: DC
  Administered 2019-01-23: 08:00:00 via INTRAVENOUS

## 2019-01-23 MED ORDER — ONDANSETRON HCL 4 MG/2ML IJ SOLN: 4.0000 mg | Freq: Once | INTRAMUSCULAR | Status: DC | PRN

## 2019-01-23 MED ORDER — ACETAMINOPHEN 325 MG PO TABS: 650.0000 mg | ORAL_TABLET | Freq: Once | ORAL | Status: DC | PRN

## 2019-01-23 MED ORDER — STERILE WATER FOR IRRIGATION IR SOLN
Status: DC | PRN
  Administered 2019-01-23: 09:00:00 50 mL

## 2019-01-23 SURGICAL SUPPLY — 6 items
CANISTER SUCT 1200ML W/VALVE (MISCELLANEOUS) ×2 IMPLANT
FORCEPS BIOP RAD 4 LRG CAP 4 (CUTTING FORCEPS) ×1 IMPLANT
GOWN CVR UNV OPN BCK APRN NK (MISCELLANEOUS) ×2 IMPLANT
GOWN ISOL THUMB LOOP REG UNIV (MISCELLANEOUS) ×2
KIT ENDO PROCEDURE OLY (KITS) ×2 IMPLANT
WATER STERILE IRR 250ML POUR (IV SOLUTION) ×2 IMPLANT

## 2019-01-23 NOTE — Transfer of Care (Signed)
Immediate Anesthesia Transfer of Care Note  Patient: Bryan Cortez  Procedure(s) Performed: COLONOSCOPY WITH BIOPSIES (N/A Rectum) POLYPECTOMY (N/A Rectum)  Patient Location: PACU  Anesthesia Type: General  Level of Consciousness: awake, alert  and patient cooperative  Airway and Oxygen Therapy: Patient Spontanous Breathing and Patient connected to supplemental oxygen  Post-op Assessment: Post-op Vital signs reviewed, Patient's Cardiovascular Status Stable, Respiratory Function Stable, Patent Airway and No signs of Nausea or vomiting  Post-op Vital Signs: Reviewed and stable  Complications: No apparent anesthesia complications

## 2019-01-23 NOTE — Anesthesia Procedure Notes (Signed)
Procedure Name: MAC Date/Time: 01/23/2019 8:45 AM Performed by: Georga Bora, CRNA Pre-anesthesia Checklist: Patient identified, Emergency Drugs available, Suction available, Patient being monitored and Timeout performed Patient Re-evaluated:Patient Re-evaluated prior to induction Oxygen Delivery Method: Nasal cannula

## 2019-01-23 NOTE — H&P (Signed)
Lucilla Lame, MD [facility] [address] [place], [place] [address] Phone: [phone] Fax : [phone]  Primary Care Physician:  Virginia Crews, MD Primary Gastroenterologist:  Dr. Allen Norris  Pre-Procedure History & Physical: HPI:  Bryan Cortez is a 48 y.o. male is here for a screening colonoscopy.   Past Medical History:  Diagnosis Date  . Arthritis    right ankle  . Dental bridge present    Top front  . Hypertension   . Kidney stone   . Motion sickness    boats    Past Surgical History:  Procedure Laterality Date  . TESTICLE SURGERY Bilateral 1974   undistended    Prior to Admission medications   Medication Sig Start Date End Date Taking? Authorizing Provider  hydrochlorothiazide (HYDRODIURIL) 25 MG tablet Take 1 tablet (25 mg total) by mouth daily. 05/27/18  Yes Bacigalupo, Dionne Bucy, MD  lisinopril (ZESTRIL) 20 MG tablet Take 1 tablet (20 mg total) by mouth daily. 01/13/19  Yes Bacigalupo, Dionne Bucy, MD  Nitroglycerin 0.4 % OINT Place rectally.   Yes [provider]    Allergies as of 01/16/2019 - Review Complete 01/13/2019  Allergen Reaction Noted  . Naproxen Hives 11/22/2016    Family History  Problem Relation Age of Onset  . Bladder Cancer Father   . Heart disease Father   . Cancer Father        bladder  . Dementia Father   . Kidney Stones Sister   . Obesity Sister   . Other Sister        prediabetes  . Cancer Mother        unknown CA  . Anxiety disorder Brother   . Kidney cancer Neg Hx   . Prostate cancer Neg Hx     Social History   Socioeconomic History  . Marital status: Married    Spouse name: Nereo Shobert  . Number of children: 2  . Years of education: some college  . Highest education level: Not on file  Occupational History    Employer: [name]  Social Needs  . Financial resource strain: Not on file  . Food insecurity    Worry: Not on file    Inability: Not on file  . Transportation needs    Medical:  Not on file    Non-medical: Not on file  Tobacco Use  . Smoking status: Never Smoker  . Smokeless tobacco: Never Used  Substance and Sexual Activity  . Alcohol use: No    Comment: rarely  . Drug use: No  . Sexual activity: Yes  Lifestyle  . Physical activity    Days per week: Not on file    Minutes per session: Not on file  . Stress: Not on file  Relationships  . Social Herbalist on phone: Not on file    Gets together: Not on file    Attends religious service: Not on file    Active member of club or organization: Not on file    Attends meetings of clubs or organizations: Not on file    Relationship status: Not on file  . Intimate partner violence    Fear of current or ex partner: Not on file    Emotionally abused: Not on file    Physically abused: Not on file    Forced sexual activity: Not on file  Other Topics Concern  . Not on file  Social History Narrative  . Not on file  Review of Systems: See HPI, otherwise negative ROS  Physical Exam: Ht 6' (1.829 m)   Wt 102.1 kg   BMI 30.52 kg/m  General:   Alert,  pleasant and cooperative in NAD Head:  Normocephalic and atraumatic. Neck:  Supple; no masses or thyromegaly. Lungs:  Clear throughout to auscultation.    Heart:  Regular rate and rhythm. Abdomen:  Soft, nontender and nondistended. Normal bowel sounds, without guarding, and without rebound.   Neurologic:  Alert and  oriented x4;  grossly normal neurologically.  Impression/Plan: Bryan Cortez is now here to undergo a screening colonoscopy.  Risks, benefits, and alternatives regarding colonoscopy have been reviewed with the patient.  Questions have been answered.  All parties agreeable.

## 2019-01-23 NOTE — Anesthesia Postprocedure Evaluation (Signed)
Anesthesia Post Note  Patient: Bryan Cortez  Procedure(s) Performed: COLONOSCOPY WITH BIOPSIES (N/A Rectum) POLYPECTOMY (N/A Rectum)  Patient location during evaluation: PACU Anesthesia Type: General Level of consciousness: awake and alert, oriented and patient cooperative Pain management: pain level controlled Vital Signs Assessment: post-procedure vital signs reviewed and stable Respiratory status: spontaneous breathing, nonlabored ventilation and respiratory function stable Cardiovascular status: blood pressure returned to baseline and stable Postop Assessment: adequate PO intake Anesthetic complications: no    Darrin Nipper

## 2019-01-23 NOTE — Op Note (Signed)
[facility] Patient Name: Bryan Cortez Procedure Date: 01/23/2019 8:37 AM MRN: [id number] Account #: [id number] Date of Birth: 11/12/1970 Admit Type: Outpatient Age: 48 Room: [facility] OR ROOM 01 Gender: Male Note Status: Finalized Procedure:            Colonoscopy Indications:          Screening for colorectal malignant neoplasm Providers:            Lucilla Lame MD, MD Referring MD:         Dionne Bucy. Bacigalupo (Referring MD) Medicines:            Monitored Anesthesia Care, Propofol per Anesthesia Complications:        No immediate complications. Procedure:            Pre-Anesthesia Assessment:                       - Prior to the procedure, a History and Physical was                        performed, and patient medications and allergies were                        reviewed. The patient's tolerance of previous                        anesthesia was also reviewed. The risks and benefits of                        the procedure and the sedation options and risks were                        discussed with the patient. All questions were                        answered, and informed consent was obtained. Prior                        Anticoagulants: The patient has taken no previous                        anticoagulant or antiplatelet agents. ASA Grade                        Assessment: II - A patient with mild systemic disease.                        After reviewing the risks and benefits, the patient was                        deemed in satisfactory condition to undergo the                        procedure.                       After obtaining informed consent, the colonoscope was                        passed under direct vision. Throughout the procedure,  the patient's blood pressure, pulse, and oxygen                        saturations were monitored continuously. The was                        introduced through the anus and  advanced to the the                        cecum, identified by appendiceal orifice and ileocecal                        valve. The colonoscopy was performed without                        difficulty. The patient tolerated the procedure well.                        The quality of the bowel preparation was excellent. Findings:      The perianal and digital rectal examinations were normal.      A 2 mm polyp was found in the transverse colon. The polyp was sessile.       The polyp was removed with a cold biopsy forceps. Resection and       retrieval were complete.      A 4 mm polyp was found in the ascending colon. The polyp was sessile.       The polyp was removed with a cold biopsy forceps. Resection and       retrieval were complete.      Non-bleeding internal hemorrhoids were found during retroflexion. The       hemorrhoids were Grade I (internal hemorrhoids that do not prolapse). Impression:           - One 4 mm polyp in the transverse colon, removed with                        a cold biopsy forceps. Resected and retrieved.                       - One 4 mm polyp in the ascending colon, removed with a                        cold biopsy forceps. Resected and retrieved.                       - Non-bleeding internal hemorrhoids. Recommendation:       - Discharge patient to home.                       - Resume previous diet.                       - Continue present medications.                       - Await pathology results.                       - Repeat colonoscopy in 5 years if polyp adenoma and 10  years if hyperplastic Procedure Code(s):    --- Professional ---                       [phone], Colonoscopy, flexible; with biopsy, single or                        multiple Diagnosis Code(s):    --- Professional ---                       Z12.11, Encounter for screening for malignant neoplasm                        of colon                       K63.5, Polyp of  colon CPT copyright 2019 American Medical Association. All rights reserved. The codes documented in this report are preliminary and upon coder review may  be revised to meet current compliance requirements. Lucilla Lame MD, MD 01/23/2019 8:58:30 AM This report has been signed electronically. Number of Addenda: 0 Note Initiated On: 01/23/2019 8:37 AM Scope Withdrawal Time: 0 hours 8 minutes 44 seconds  Total Procedure Duration: 0 hours 11 minutes 6 seconds  Estimated Blood Loss: Estimated blood loss: none.      [facility]

## 2019-01-26 ENCOUNTER — Encounter: Payer: Self-pay | Admitting: Gastroenterology

## 2019-01-27 ENCOUNTER — Encounter: Payer: Self-pay | Admitting: Gastroenterology

## 2019-02-06 ENCOUNTER — Encounter: Payer: Self-pay | Admitting: Family Medicine

## 2019-02-11 ENCOUNTER — Other Ambulatory Visit (INDEPENDENT_AMBULATORY_CARE_PROVIDER_SITE_OTHER): Payer: Self-pay | Admitting: Vascular Surgery

## 2019-02-11 DIAGNOSIS — I83812 Varicose veins of left lower extremities with pain: Secondary | ICD-10-CM

## 2019-02-12 ENCOUNTER — Encounter: Payer: Self-pay | Admitting: Family Medicine

## 2019-02-12 ENCOUNTER — Ambulatory Visit: Payer: Federal, State, Local not specified - PPO | Admitting: Family Medicine

## 2019-02-12 ENCOUNTER — Other Ambulatory Visit: Payer: Self-pay

## 2019-02-12 VITALS — BP 131/84 | HR 57 | Temp 97.1°F | Wt 227.8 lb

## 2019-02-12 DIAGNOSIS — K6289 Other specified diseases of anus and rectum: Secondary | ICD-10-CM

## 2019-02-12 DIAGNOSIS — I10 Essential (primary) hypertension: Secondary | ICD-10-CM

## 2019-02-12 DIAGNOSIS — G8929 Other chronic pain: Secondary | ICD-10-CM | POA: Diagnosis not present

## 2019-02-12 MED ORDER — HYDROCORTISONE ACETATE 25 MG RE SUPP: 25.0000 mg | Freq: Two times a day (BID) | RECTAL | 3 refills | Status: DC | PRN

## 2019-02-12 NOTE — Progress Notes (Signed)
Patient: Bryan Cortez Male    DOB: [birth year]   48 y.o.   MRN: [id number] Visit Date: 02/12/2019  Today's Provider: Lavon Paganini, MD   Chief Complaint  Patient presents with  . Hypertension   Subjective:    I, Tiburcio Pea, CMA, am acting as a Education administrator for Lavon Paganini, MD.   HPI  Hypertension, follow-up:  BP Readings from Last 3 Encounters:  02/12/19 131/84  01/23/19 (!) 115/93  01/13/19 (!) 146/88    He was last seen for hypertension 1 months ago.  BP at that visit was 146/88. Management changes since that visit include added Lisinopril 20 mg daily. He reports good compliance with treatment. He is not having side effects.  He is not exercising. He is adherent to low salt diet.   Outside blood pressures are not being checked at home. He is experiencing none.  Patient denies chest pain, chest pressure/discomfort, claudication, dyspnea, exertional chest pressure/discomfort, fatigue, irregular heart beat, lower extremity edema, near-syncope, orthopnea, palpitations, paroxysmal nocturnal dyspnea, syncope and tachypnea.   Cardiovascular risk factors include hypertension and male gender.  Use of agents associated with hypertension: none.     Weight trend: stable Wt Readings from Last 3 Encounters:  02/12/19 227 lb 12.8 oz (103.3 kg)  01/23/19 219 lb (99.3 kg)  01/13/19 224 lb 3.2 oz (101.7 kg)   ------------------------------------------------------------------------ States that anal pain is getting worse.  Pelvic PT has helped initially, but now pain is worse again.  Would like to see the surgeons again to discuss further management   Allergies  Allergen Reactions  . Naproxen Hives     Current Outpatient Medications:  .  hydrochlorothiazide (HYDRODIURIL) 25 MG tablet, Take 1 tablet (25 mg total) by mouth daily., Disp: 90 tablet, Rfl: 2 .  lisinopril (ZESTRIL) 20 MG tablet, Take 1 tablet (20 mg total) by mouth daily., Disp: 30 tablet, Rfl: 3 .   Nitroglycerin 0.4 % OINT, Place rectally., Disp: , Rfl:   Review of Systems  Constitutional: Negative.   Respiratory: Negative.   Cardiovascular: Negative.   Musculoskeletal: Negative.     Social History   Tobacco Use  . Smoking status: Never Smoker  . Smokeless tobacco: Never Used  Substance Use Topics  . Alcohol use: No    Comment: rarely      Objective:   BP 131/84 (BP Location: Right Arm, Patient Position: Sitting, Cuff Size: Large)   Pulse (!) 57   Temp (!) 97.1 F (36.2 C) (Temporal)   Wt 227 lb 12.8 oz (103.3 kg)   SpO2 98%   BMI 30.90 kg/m  Vitals:   02/12/19 0952  BP: 131/84  Pulse: (!) 57  Temp: (!) 97.1 F (36.2 C)  TempSrc: Temporal  SpO2: 98%  Weight: 227 lb 12.8 oz (103.3 kg)  Body mass index is 30.9 kg/m.   Physical Exam Vitals signs reviewed.  Constitutional:      General: He is not in acute distress.    Appearance: Normal appearance. He is not diaphoretic.  HENT:     Head: Normocephalic and atraumatic.  Eyes:     General: No scleral icterus.    Conjunctiva/sclera: Conjunctivae normal.  Neck:     Musculoskeletal: Neck supple.  Cardiovascular:     Rate and Rhythm: Normal rate and regular rhythm.     Pulses: Normal pulses.     Heart sounds: Normal heart sounds. No murmur.  Pulmonary:     Effort: Pulmonary effort is normal.  No respiratory distress.     Breath sounds: Normal breath sounds. No wheezing or rhonchi.  Abdominal:     General: There is no distension.     Palpations: Abdomen is soft.     Tenderness: There is no abdominal tenderness.  Musculoskeletal:     Right lower leg: No edema.     Left lower leg: No edema.  Lymphadenopathy:     Cervical: No cervical adenopathy.  Skin:    General: Skin is warm and dry.     Capillary Refill: Capillary refill takes less than 2 seconds.     Findings: No rash.  Neurological:     Mental Status: He is alert and oriented to person, place, and time.     Cranial Nerves: No cranial nerve  deficit.  Psychiatric:        Mood and Affect: Mood normal.        Behavior: Behavior normal.      No results found for any visits on 02/12/19.     Assessment & Plan   Problem List Items Addressed This Visit      Cardiovascular and Mediastinum   Hypertension - Primary    Well controlled Continue current medications Recheck metabolic panel F/u in 6 months       Relevant Orders   Basic Metabolic Panel (BMET)     Digestive   Chronic idiopathic anal pain    Re-refer to Colorectal srugery for further management Has tried at home Pelvic PT (not in person due to pandemic)       Other Visit Diagnoses    Rectal pain       Relevant Orders   Ambulatory referral to Colorectal Surgery       Return in about 6 months (around 08/13/2019) for chronic disease f/u.   The entirety of the information documented in the History of Present Illness, Review of Systems and Physical Exam were personally obtained by me. Portions of this information were initially documented by Tiburcio Pea, CMA and reviewed by me for thoroughness and accuracy.    Sanye Ledesma, Dionne Bucy, MD MPH [facility]

## 2019-02-12 NOTE — Assessment & Plan Note (Signed)
Well controlled Continue current medications Recheck metabolic panel F/u in 6 months  

## 2019-02-12 NOTE — Assessment & Plan Note (Signed)
Re-refer to Colorectal srugery for further management Has tried at home Pelvic PT (not in person due to pandemic)

## 2019-02-13 ENCOUNTER — Telehealth: Payer: Self-pay

## 2019-02-13 ENCOUNTER — Ambulatory Visit (INDEPENDENT_AMBULATORY_CARE_PROVIDER_SITE_OTHER): Payer: Federal, State, Local not specified - PPO

## 2019-02-13 ENCOUNTER — Encounter (INDEPENDENT_AMBULATORY_CARE_PROVIDER_SITE_OTHER): Payer: Self-pay | Admitting: Nurse Practitioner

## 2019-02-13 ENCOUNTER — Other Ambulatory Visit: Payer: Self-pay

## 2019-02-13 ENCOUNTER — Ambulatory Visit (INDEPENDENT_AMBULATORY_CARE_PROVIDER_SITE_OTHER): Payer: Federal, State, Local not specified - PPO | Admitting: Nurse Practitioner

## 2019-02-13 VITALS — BP 135/88 | HR 47 | Resp 12 | Ht 72.0 in | Wt 225.0 lb

## 2019-02-13 DIAGNOSIS — I83812 Varicose veins of left lower extremities with pain: Secondary | ICD-10-CM | POA: Diagnosis not present

## 2019-02-13 DIAGNOSIS — I10 Essential (primary) hypertension: Secondary | ICD-10-CM | POA: Diagnosis not present

## 2019-02-13 LAB — BASIC METABOLIC PANEL
BUN/Creatinine Ratio: 12 (ref 9–20)
BUN: 13 mg/dL (ref 6–24)
CO2: 22 mmol/L (ref 20–29)
Calcium: 9.1 mg/dL (ref 8.7–10.2)
Chloride: 103 mmol/L (ref 96–106)
Creatinine, Ser: 1.06 mg/dL (ref 0.76–1.27)
GFR calc Af Amer: 94 mL/min/{1.73_m2} (ref >59)
GFR calc non Af Amer: 81 mL/min/{1.73_m2} (ref >59)
Glucose: 95 mg/dL (ref 65–99)
Potassium: 4 mmol/L (ref 3.5–5.2)
Sodium: 139 mmol/L (ref 134–144)

## 2019-02-13 NOTE — Progress Notes (Signed)
SUBJECTIVE:  Patient ID: Bryan Cortez, male    DOB: Jul 30, 1970, 48 y.o.   MRN: [id number] Chief Complaint  Patient presents with  . New Patient (Initial Visit)    HPI  Bryan Cortez is a 48 y.o. male The patient is seen for evaluation of symptomatic varicose veins of his left lower extremity. The patient relates burning and stinging which worsened steadily throughout the course of the day, particularly with standing. The patient also notes an aching and throbbing pain over the varicosities, particularly with prolonged dependent positions. The symptoms are significantly improved with elevation.  The patient also notes that during hot weather the symptoms are greatly intensified. The patient states the pain from the varicose veins interferes with work, daily exercise, shopping and household maintenance. At this point, the symptoms are persistent and severe enough that they're having a negative impact on lifestyle and are interfering with daily activities.  There is no history of DVT, PE or superficial thrombophlebitis. There is no history of ulceration or hemorrhage. The patient denies a significant family history of varicose veins.   The patient has not worn graduated compression in the past. At the present time the patient has not been using over-the-counter analgesics. There is no history of prior surgical intervention or sclerotherapy.  The patient underwent noninvasive studies today which revealed evidence of reflux throughout the left great saphenous vein.  There is no evidence of DVT or superficial venous thrombosis in the left lower extremity.  Past Medical History:  Diagnosis Date  . Arthritis    right ankle  . Dental bridge present    Top front  . Hypertension   . Kidney stone   . Motion sickness    boats    Past Surgical History:  Procedure Laterality Date  . COLONOSCOPY WITH PROPOFOL N/A 01/23/2019   Procedure: COLONOSCOPY WITH BIOPSIES;  Surgeon: Lucilla Lame, MD;   Location: [facility];  Service: Endoscopy;  Laterality: N/A;  . POLYPECTOMY N/A 01/23/2019   Procedure: POLYPECTOMY;  Surgeon: Lucilla Lame, MD;  Location: [facility];  Service: Endoscopy;  Laterality: N/A;  . TESTICLE SURGERY Bilateral 1974   undistended    Social History   Socioeconomic History  . Marital status: Married    Spouse name: Rogerio Angley  . Number of children: 2  . Years of education: some college  . Highest education level: Not on file  Occupational History    Employer: [name]  Social Needs  . Financial resource strain: Not on file  . Food insecurity    Worry: Not on file    Inability: Not on file  . Transportation needs    Medical: Not on file    Non-medical: Not on file  Tobacco Use  . Smoking status: Never Smoker  . Smokeless tobacco: Never Used  Substance and Sexual Activity  . Alcohol use: No    Comment: rarely  . Drug use: No  . Sexual activity: Yes  Lifestyle  . Physical activity    Days per week: Not on file    Minutes per session: Not on file  . Stress: Not on file  Relationships  . Social Herbalist on phone: Not on file    Gets together: Not on file    Attends religious service: Not on file    Active member of club or organization: Not on file    Attends meetings of clubs or organizations: Not on file    Relationship  status: Not on file  . Intimate partner violence    Fear of current or ex partner: Not on file    Emotionally abused: Not on file    Physically abused: Not on file    Forced sexual activity: Not on file  Other Topics Concern  . Not on file  Social History Narrative  . Not on file    Family History  Problem Relation Age of Onset  . Bladder Cancer Father   . Heart disease Father   . Cancer Father        bladder  . Dementia Father   . Kidney Stones Sister   . Obesity Sister   . Other Sister        prediabetes  . Cancer Mother        unknown CA  . Anxiety disorder Brother   .  Kidney cancer Neg Hx   . Prostate cancer Neg Hx     Allergies  Allergen Reactions  . Naproxen Hives     Review of Systems   Review of Systems: Negative Unless Checked Constitutional: [] Weight loss  [] Fever  [] Chills Cardiac: [] Chest pain   []  Atrial Fibrillation  [] Palpitations   [] Shortness of breath when laying flat   [] Shortness of breath with exertion. [] Shortness of breath at rest Vascular:  [] Pain in legs with walking   [] Pain in legs with standing [] Pain in legs when laying flat   [] Claudication    [] Pain in feet when laying flat    [] History of DVT   [] Phlebitis   [x] Swelling in legs   [x] Varicose veins   [] Non-healing ulcers Pulmonary:   [] Uses home oxygen   [] Productive cough   [] Hemoptysis   [] Wheeze  [] COPD   [] Asthma Neurologic:  [] Dizziness   [] Seizures  [] Blackouts [] History of stroke   [] History of TIA  [] Aphasia   [] Temporary Blindness   [] Weakness or numbness in arm   [] Weakness or numbness in leg Musculoskeletal:   [] Joint swelling   [] Joint pain   [] Low back pain  []  History of Knee Replacement [] Arthritis [] back Surgeries  []  Spinal Stenosis    Hematologic:  [] Easy bruising  [] Easy bleeding   [] Hypercoagulable state   [] Anemic Gastrointestinal:  [] Diarrhea   [] Vomiting  [] Gastroesophageal reflux/heartburn   [] Difficulty swallowing. [] Abdominal pain Genitourinary:  [] Chronic kidney disease   [] Difficult urination  [] Anuric   [] Blood in urine [] Frequent urination  [] Burning with urination   [] Hematuria Skin:  [] Rashes   [] Ulcers [] Wounds Psychological:  [] History of anxiety   []  History of major depression  []  Memory Difficulties      OBJECTIVE:   Physical Exam  BP 135/88 (BP Location: Left Arm, Patient Position: Sitting, Cuff Size: Normal)   Pulse (!) 47   Resp 12   Ht 6' (1.829 m)   Wt 225 lb (102.1 kg)   BMI 30.52 kg/m   Gen: WD/WN, NAD Head: [place]/AT, No temporalis wasting.  Ear/Nose/Throat: Hearing grossly intact, nares w/o erythema or drainage Eyes: PER,  EOMI, sclera nonicteric.  Neck: Supple, no masses.  No JVD.  Pulmonary:  Good air movement, no use of accessory muscles.  Cardiac: RRR Vascular:  Prominent varicosities along the patient's left lower extremity.  Measuring 6 to 8 mm in size Vessel Right Left  Radial Palpable Palpable  Dorsalis Pedis Palpable Palpable  Posterior Tibial Palpable Palpable   Gastrointestinal: soft, non-distended. No guarding/no peritoneal signs.  Musculoskeletal: M/S 5/5 throughout.  No deformity or atrophy.  Neurologic: Pain and light touch  intact in extremities.  Symmetrical.  Speech is fluent. Motor exam as listed above. Psychiatric: Judgment intact, Mood & affect appropriate for pt's clinical situation. Dermatologic: No Venous rashes. No Ulcers Noted.  No changes consistent with cellulitis. Lymph : No Cervical lymphadenopathy, no lichenification or skin changes of chronic lymphedema.       ASSESSMENT AND PLAN:  1. Varicose veins of left lower extremity with pain  Recommend:  The patient has large symptomatic varicose veins that are painful and associated with swelling.  I have had a long discussion with the patient regarding  varicose veins and why they cause symptoms.  Patient will begin wearing graduated compression stockings class 1 on a daily basis, beginning first thing in the morning and removing them in the evening. The patient is instructed specifically not to sleep in the stockings.    The patient  will also begin using over-the-counter analgesics such as Motrin 600 mg po TID to help control the symptoms.    In addition, behavioral modification including elevation during the day will be initiated.    Pending the results of these changes the  patient will be reevaluated in three months.   Further plans will be based on the ultrasound results and whether conservative therapies are successful at eliminating the pain and swelling.   2. Essential hypertension Continue antihypertensive  medications as already ordered, these medications have been reviewed and there are no changes at this time.    Current Outpatient Medications on File Prior to Visit  Medication Sig Dispense Refill  . hydrochlorothiazide (HYDRODIURIL) 25 MG tablet Take 1 tablet (25 mg total) by mouth daily. 90 tablet 2  . hydrocortisone (ANUSOL-HC) 25 MG suppository Place 1 suppository (25 mg total) rectally 2 (two) times daily as needed for hemorrhoids or anal itching. 30 suppository 3  . lisinopril (ZESTRIL) 20 MG tablet Take 1 tablet (20 mg total) by mouth daily. 30 tablet 3  . Nitroglycerin 0.4 % OINT Place rectally.     No current facility-administered medications on file prior to visit.     There are no Patient Instructions on file for this visit. No follow-ups on file.   Kris Hartmann, NP  This note was completed with Sales executive.  Any errors are purely unintentional.

## 2019-02-13 NOTE — Telephone Encounter (Signed)
Patient was advised.  

## 2019-02-13 NOTE — Telephone Encounter (Signed)
-----   Message from Virginia Crews, MD sent at 02/13/2019 11:53 AM EDT ----- Normal labs

## 2019-02-23 ENCOUNTER — Ambulatory Visit: Payer: Self-pay | Admitting: General Surgery

## 2019-02-23 DIAGNOSIS — N343 Urethral syndrome, unspecified: Secondary | ICD-10-CM | POA: Diagnosis not present

## 2019-02-23 NOTE — H&P (Signed)
History of Present Illness Leighton Ruff MD; Q000111Q 5:10 PM) The patient is a 48 year old male who presents with a complaint of anal problems. 48 year old male who presents to the office with complaints of anal pain for approximately one year. He states that this started after developing diarrhea and this causing an anal fissure. He was treated medically for this and his fissure did improve. He states that over the past 12 months he has had waxing and waning symptoms of anal pain. This is mostly pain externally. He describes the pain mostly several hours after bowel movements. He denies any sharp pain with bowel movements. He is having soft stools after changing some of his diet. He is using rectiv ointment at night and nefedapime ointment in the morning. He has been doing this for several months. He is using cortisone suppositories as needed. I saw him a proximal Lee 4 months ago. We referred him for pelvic floor physical therapy. They were unable to do an impression consultation due to coated restrictions and therefore he was given home exercises. He has been doing these and noticed some relief of his rectal spasms, but continues to have quite a bit of burning pain after bowel movements.   Problem List/Past Medical Leighton Ruff, MD; Q000111Q 5:10 PM) PELVIC FLOOR TENSION (N34.3)  Past Surgical History Leighton Ruff, MD; Q000111Q 5:10 PM) Vasectomy  Diagnostic Studies History Leighton Ruff, MD; Q000111Q 5:10 PM) Colonoscopy never  Allergies (Tanisha A. Owens Shark, [place]; 02/23/2019 4:38 PM) Naprosyn *ANALGESICS - ANTI-INFLAMMATORY* Allergies Reconciled  Medication History (Tanisha A. Owens Shark, [place]; 02/23/2019 4:38 PM) hydroCHLOROthiazide (25MG  Tablet, Oral) Active. Anucort-HC (25MG  Suppository, Rectal) Active. Lisinopril (20MG  Tablet, Oral) Active. Medications Reconciled  Social History Leighton Ruff, MD; Q000111Q 5:10 PM) Alcohol use Occasional alcohol  use. Caffeine use Carbonated beverages, Tea. Illicit drug use Remotely quit drug use. Tobacco use Never smoker.  Family History Leighton Ruff, MD; Q000111Q 5:10 PM) Alcohol Abuse Father. Arthritis Sister. Cancer Mother. Depression Brother. Heart Disease Father. Hypertension Father. Migraine Headache Brother.  Other Problems Leighton Ruff, MD; Q000111Q 5:10 PM) High blood pressure Migraine Headache     Review of Systems Leighton Ruff MD; Q000111Q 5:10 PM) General Not Present- Appetite Loss, Chills, Fatigue, Fever, Night Sweats, Weight Gain and Weight Loss. Skin Not Present- Change in Wart/Mole, Dryness, Hives, Jaundice, New Lesions, Non-Healing Wounds, Rash and Ulcer. HEENT Present- Ringing in the Ears and Seasonal Allergies. Not Present- Earache, Hearing Loss, Hoarseness, Nose Bleed, Oral Ulcers, Sinus Pain, Sore Throat, Visual Disturbances, Wears glasses/contact lenses and Yellow Eyes. Respiratory Not Present- Bloody sputum, Chronic Cough, Difficulty Breathing, Snoring and Wheezing. Breast Not Present- Breast Mass, Breast Pain, Nipple Discharge and Skin Changes. Cardiovascular Not Present- Chest Pain, Difficulty Breathing Lying Down, Leg Cramps, Palpitations, Rapid Heart Rate, Shortness of Breath and Swelling of Extremities. Gastrointestinal Present- Rectal Pain. Not Present- Abdominal Pain, Bloating, Bloody Stool, Change in Bowel Habits, Chronic diarrhea, Constipation, Difficulty Swallowing, Excessive gas, Gets full quickly at meals, Hemorrhoids, Indigestion, Nausea and Vomiting. Male Genitourinary Not Present- Blood in Urine, Change in Urinary Stream, Frequency, Impotence, Nocturia, Painful Urination, Urgency and Urine Leakage. Musculoskeletal Not Present- Back Pain, Joint Pain, Joint Stiffness, Muscle Pain, Muscle Weakness and Swelling of Extremities. Neurological Not Present- Decreased Memory, Fainting, Headaches, Numbness, Seizures, Tingling, Tremor, Trouble  walking and Weakness. Psychiatric Not Present- Anxiety, Bipolar, Change in Sleep Pattern, Depression, Fearful and Frequent crying. Endocrine Not Present- Cold Intolerance, Excessive Hunger, Hair Changes, Heat Intolerance and New Diabetes. Hematology Not Present- Blood Thinners, Easy  Bruising, Excessive bleeding, Gland problems, HIV and Persistent Infections.  Vitals (Tanisha A. Brown RMA; 02/23/2019 4:38 PM) 02/23/2019 4:38 PM Weight: 224.8 lb Height: 72in Body Surface Area: 2.24 m Body Mass Index: 30.49 kg/m  Temp.: 98.29F  Pulse: 96 (Regular)  BP: 124/82 (Sitting, Left Arm, Standard)        Physical Exam Leighton Ruff MD; Q000111Q 5:11 PM)  General Mental Status-Alert. General Appearance-Not in acute distress. Build & Nutrition-Well nourished. Posture-Normal posture. Gait-Normal.  Head and Neck Head-normocephalic, atraumatic with no lesions or palpable masses. Trachea-midline.  Chest and Lung Exam Chest and lung exam reveals -on auscultation, normal breath sounds, no adventitious sounds and normal vocal resonance.  Cardiovascular Cardiovascular examination reveals -normal heart sounds, regular rate and rhythm with no murmurs and femoral artery auscultation bilaterally reveals normal pulses, no bruits, no thrills.  Abdomen Inspection Inspection of the abdomen reveals - No Hernias. Palpation/Percussion Palpation and Percussion of the abdomen reveal - Soft, Non Tender, No Rigidity (guarding), No hepatosplenomegaly and No Palpable abdominal masses.  Rectal Anorectal Exam External - normal external exam. Internal - normal sphincter tone. Note: no TTP bilateral puborectalis, less spasm noted  Neurologic Neurologic evaluation reveals -alert and oriented x 3 with no impairment of recent or remote memory, normal attention span and ability to concentrate, normal sensation and normal coordination.  Musculoskeletal Normal Exam -  Bilateral-Upper Extremity Strength Normal and Lower Extremity Strength Normal.    Assessment & Plan Leighton Ruff MD; Q000111Q 4:57 PM)  PELVIC FLOOR TENSION (N34.3) Impression: Patient has completed pelvic floor physical therapy at home, but did not receive any biofeedback treatment or in person evaluation. On exam today, he does have quite a bit less spasm, but continues to have burning pain after bowel movements. Given these findings, consistent with levator ani syndrome, I have recommended Botox pelvic floor injections. Hopefully, this will reset his muscle tone and allow him to defecate without pain. We discussed that this would be a temporary treatment. I think he has a good chance that success, as he has seemed to treat the underlying cause as well. We will also evaluate his anal canal for possible hemorrhoid disease that may be causing some of his symptoms.

## 2019-03-09 ENCOUNTER — Other Ambulatory Visit: Payer: Self-pay | Admitting: Family Medicine

## 2019-03-09 MED ORDER — HYDROCHLOROTHIAZIDE 25 MG PO TABS: 25.0000 mg | ORAL_TABLET | Freq: Every day | ORAL | 2 refills | Status: DC

## 2019-03-09 NOTE — Telephone Encounter (Signed)
[facility] is requesting refill authorization for HCTZ 25 mg.

## 2019-03-23 ENCOUNTER — Encounter: Payer: Self-pay | Admitting: Family Medicine

## 2019-03-25 ENCOUNTER — Encounter (HOSPITAL_BASED_OUTPATIENT_CLINIC_OR_DEPARTMENT_OTHER): Payer: Self-pay | Admitting: *Deleted

## 2019-03-25 ENCOUNTER — Other Ambulatory Visit: Payer: Self-pay

## 2019-03-25 NOTE — Progress Notes (Signed)
Spoke with patient via telephone for pre op interview. NPO after MN. No medications AM of surgery. Will need IStat 8 and EKG AM of surgery. Arrival time 0630.

## 2019-03-28 ENCOUNTER — Other Ambulatory Visit (HOSPITAL_COMMUNITY)
Admission: RE | Admit: 2019-03-28 | Discharge: 2019-03-28 | Disposition: A | Discharge status: Home / Self Care | Payer: Federal, State, Local not specified - PPO | Source: Ambulatory Visit | Attending: General Surgery | Admitting: General Surgery

## 2019-03-28 DIAGNOSIS — Z20828 Contact with and (suspected) exposure to other viral communicable diseases: Secondary | ICD-10-CM | POA: Diagnosis not present

## 2019-03-28 DIAGNOSIS — Z01812 Encounter for preprocedural laboratory examination: Secondary | ICD-10-CM | POA: Diagnosis not present

## 2019-03-29 LAB — NOVEL CORONAVIRUS, NAA (HOSP ORDER, SEND-OUT TO REF LAB; TAT 18-24 HRS): SARS-CoV-2, NAA: NOT DETECTED

## 2019-03-31 NOTE — Anesthesia Preprocedure Evaluation (Addendum)
Anesthesia Evaluation  Patient identified by MRN, date of birth, ID band Patient awake    Reviewed: Allergy & Precautions, NPO status , Patient's Chart, lab work & pertinent test results  Airway Mallampati: I       Dental no notable dental hx. (+) Teeth Intact, Dental Advisory Given,    Pulmonary neg pulmonary ROS,    Pulmonary exam normal breath sounds clear to auscultation       Cardiovascular hypertension, Pt. on medications Normal cardiovascular exam Rhythm:Regular Rate:Normal     Neuro/Psych negative psych ROS   GI/Hepatic negative GI ROS, Neg liver ROS,   Endo/Other  negative endocrine ROS  Renal/GU   negative genitourinary   Musculoskeletal   Abdominal Normal abdominal exam  (+)   Peds  Hematology negative hematology ROS (+)   Anesthesia Other Findings   Reproductive/Obstetrics                           Anesthesia Physical Anesthesia Plan  ASA: II  Anesthesia Plan: MAC   Post-op Pain Management:    Induction:   PONV Risk Score and Plan: 2 and Ondansetron, Dexamethasone and Midazolam  Airway Management Planned: Natural Airway, Simple Face Mask and Nasal Cannula  Additional Equipment: None  Intra-op Plan:   Post-operative Plan:   Informed Consent: I have reviewed the patients History and Physical, chart, labs and discussed the procedure including the risks, benefits and alternatives for the proposed anesthesia with the patient or authorized representative who has indicated his/her understanding and acceptance.       Plan Discussed with: CRNA  Anesthesia Plan Comments:        Anesthesia Quick Evaluation

## 2019-04-01 ENCOUNTER — Ambulatory Visit (HOSPITAL_BASED_OUTPATIENT_CLINIC_OR_DEPARTMENT_OTHER)
Admission: RE | Admit: 2019-04-01 | Discharge: 2019-04-01 | Disposition: A | Discharge status: Home / Self Care | Payer: Federal, State, Local not specified - PPO | Attending: General Surgery | Admitting: General Surgery

## 2019-04-01 ENCOUNTER — Encounter (HOSPITAL_BASED_OUTPATIENT_CLINIC_OR_DEPARTMENT_OTHER)
Admission: RE | Disposition: A | Discharge status: Home / Self Care | Payer: Self-pay | Source: Home / Self Care | Attending: General Surgery

## 2019-04-01 ENCOUNTER — Encounter (HOSPITAL_BASED_OUTPATIENT_CLINIC_OR_DEPARTMENT_OTHER): Payer: Self-pay

## 2019-04-01 ENCOUNTER — Ambulatory Visit (HOSPITAL_BASED_OUTPATIENT_CLINIC_OR_DEPARTMENT_OTHER): Payer: Federal, State, Local not specified - PPO | Admitting: Anesthesiology

## 2019-04-01 DIAGNOSIS — K602 Anal fissure, unspecified: Secondary | ICD-10-CM | POA: Diagnosis not present

## 2019-04-01 DIAGNOSIS — K601 Chronic anal fissure: Secondary | ICD-10-CM | POA: Diagnosis not present

## 2019-04-01 DIAGNOSIS — I10 Essential (primary) hypertension: Secondary | ICD-10-CM | POA: Insufficient documentation

## 2019-04-01 DIAGNOSIS — Z79899 Other long term (current) drug therapy: Secondary | ICD-10-CM | POA: Insufficient documentation

## 2019-04-01 DIAGNOSIS — K594 Anal spasm: Secondary | ICD-10-CM | POA: Diagnosis not present

## 2019-04-01 DIAGNOSIS — J309 Allergic rhinitis, unspecified: Secondary | ICD-10-CM | POA: Diagnosis not present

## 2019-04-01 LAB — POCT I-STAT, CHEM 8
BUN: 21 mg/dL — ABNORMAL HIGH (ref 6–20)
Calcium, Ion: 1.26 mmol/L (ref 1.15–1.40)
Chloride: 103 mmol/L (ref 98–111)
Creatinine, Ser: 0.9 mg/dL (ref 0.61–1.24)
Glucose, Bld: 97 mg/dL (ref 70–99)
HCT: 45 % (ref 39.0–52.0)
Hemoglobin: 15.3 g/dL (ref 13.0–17.0)
Potassium: 3.7 mmol/L (ref 3.5–5.1)
Sodium: 141 mmol/L (ref 135–145)
TCO2: 25 mmol/L (ref 22–32)

## 2019-04-01 SURGERY — EXAM UNDER ANESTHESIA
Anesthesia: Monitor Anesthesia Care | Site: Rectum

## 2019-04-01 MED ORDER — OXYCODONE HCL 5 MG PO TABS
5.0000 mg | ORAL_TABLET | Freq: Once | ORAL | Status: DC | PRN
  Filled 2019-04-01: qty 1

## 2019-04-01 MED ORDER — SODIUM CHLORIDE (PF) 0.9 % IJ SOLN
INTRAMUSCULAR | Status: DC | PRN
  Administered 2019-04-01: 50 mL

## 2019-04-01 MED ORDER — LIDOCAINE 2% (20 MG/ML) 5 ML SYRINGE
INTRAMUSCULAR | Status: DC | PRN
  Administered 2019-04-01: 40 mg via INTRAVENOUS

## 2019-04-01 MED ORDER — GABAPENTIN 300 MG PO CAPS
ORAL_CAPSULE | ORAL | Status: AC
  Filled 2019-04-01: qty 1

## 2019-04-01 MED ORDER — TRAMADOL HCL 50 MG PO TABS: 50.0000 mg | ORAL_TABLET | Freq: Four times a day (QID) | ORAL | 0 refills | Status: DC | PRN

## 2019-04-01 MED ORDER — DEXAMETHASONE SODIUM PHOSPHATE 10 MG/ML IJ SOLN
INTRAMUSCULAR | Status: AC
  Filled 2019-04-01: qty 1

## 2019-04-01 MED ORDER — PROPOFOL 10 MG/ML IV BOLUS
INTRAVENOUS | Status: AC
  Filled 2019-04-01: qty 40

## 2019-04-01 MED ORDER — KETAMINE INJECTION FOR OPTIME (MG/KG/HR) DOCUMENTATION
INTRAVENOUS | Status: DC | PRN
  Administered 2019-04-01: .5 mg/kg/h via INTRAVENOUS

## 2019-04-01 MED ORDER — MIDAZOLAM HCL 2 MG/2ML IJ SOLN
INTRAMUSCULAR | Status: AC
  Filled 2019-04-01: qty 2

## 2019-04-01 MED ORDER — ONABOTULINUMTOXINA 100 UNITS IJ SOLR
INTRAMUSCULAR | Status: AC
  Filled 2019-04-01: qty 100

## 2019-04-01 MED ORDER — ONABOTULINUMTOXINA 100 UNITS IJ SOLR
INTRAMUSCULAR | Status: DC | PRN
  Administered 2019-04-01: 100 [IU] via INTRAMUSCULAR

## 2019-04-01 MED ORDER — DEXAMETHASONE SODIUM PHOSPHATE 10 MG/ML IJ SOLN
INTRAMUSCULAR | Status: DC | PRN
  Administered 2019-04-01: 5 mg via INTRAVENOUS

## 2019-04-01 MED ORDER — SODIUM CHLORIDE 0.9% FLUSH
3.0000 mL | Freq: Two times a day (BID) | INTRAVENOUS | Status: DC
  Filled 2019-04-01: qty 3

## 2019-04-01 MED ORDER — KETAMINE HCL 10 MG/ML IJ SOLN
INTRAMUSCULAR | Status: AC
  Filled 2019-04-01: qty 1

## 2019-04-01 MED ORDER — KETAMINE HCL 10 MG/ML IJ SOLN: INTRAMUSCULAR | Status: DC | PRN

## 2019-04-01 MED ORDER — MIDAZOLAM HCL 2 MG/2ML IJ SOLN
INTRAMUSCULAR | Status: DC | PRN
  Administered 2019-04-01: 2 mg via INTRAVENOUS

## 2019-04-01 MED ORDER — MEPERIDINE HCL 25 MG/ML IJ SOLN
6.2500 mg | INTRAMUSCULAR | Status: DC | PRN
  Filled 2019-04-01: qty 1

## 2019-04-01 MED ORDER — PROPOFOL 500 MG/50ML IV EMUL
INTRAVENOUS | Status: DC | PRN
  Administered 2019-04-01: 200 ug/kg/min via INTRAVENOUS

## 2019-04-01 MED ORDER — OXYCODONE HCL 5 MG/5ML PO SOLN
5.0000 mg | Freq: Once | ORAL | Status: DC | PRN
  Filled 2019-04-01: qty 5

## 2019-04-01 MED ORDER — ACETAMINOPHEN 500 MG PO TABS
1000.0000 mg | ORAL_TABLET | ORAL | Status: AC
  Administered 2019-04-01: 1000 mg via ORAL
  Filled 2019-04-01: qty 2

## 2019-04-01 MED ORDER — ACETAMINOPHEN 325 MG PO TABS
325.0000 mg | ORAL_TABLET | ORAL | Status: DC | PRN
  Filled 2019-04-01: qty 2

## 2019-04-01 MED ORDER — FENTANYL CITRATE (PF) 100 MCG/2ML IJ SOLN
25.0000 ug | INTRAMUSCULAR | Status: DC | PRN
  Filled 2019-04-01: qty 1

## 2019-04-01 MED ORDER — ACETAMINOPHEN 160 MG/5ML PO SOLN
325.0000 mg | ORAL | Status: DC | PRN
  Filled 2019-04-01: qty 20.3

## 2019-04-01 MED ORDER — ONDANSETRON HCL 4 MG/2ML IJ SOLN
4.0000 mg | Freq: Once | INTRAMUSCULAR | Status: DC | PRN
  Filled 2019-04-01: qty 2

## 2019-04-01 MED ORDER — GABAPENTIN 300 MG PO CAPS
300.0000 mg | ORAL_CAPSULE | ORAL | Status: AC
  Administered 2019-04-01: 300 mg via ORAL
  Filled 2019-04-01: qty 1

## 2019-04-01 MED ORDER — ACETAMINOPHEN 500 MG PO TABS
ORAL_TABLET | ORAL | Status: AC
  Filled 2019-04-01: qty 2

## 2019-04-01 MED ORDER — ACETAMINOPHEN 10 MG/ML IV SOLN
1000.0000 mg | Freq: Once | INTRAVENOUS | Status: DC | PRN
  Filled 2019-04-01: qty 100

## 2019-04-01 MED ORDER — LACTATED RINGERS IV SOLN
INTRAVENOUS | Status: DC
  Administered 2019-04-01: 1000 mL via INTRAVENOUS
  Administered 2019-04-01: 08:00:00 via INTRAVENOUS
  Filled 2019-04-01: qty 1000

## 2019-04-01 MED ORDER — ONDANSETRON HCL 4 MG/2ML IJ SOLN
INTRAMUSCULAR | Status: AC
  Filled 2019-04-01: qty 2

## 2019-04-01 MED ORDER — ONDANSETRON HCL 4 MG/2ML IJ SOLN
INTRAMUSCULAR | Status: DC | PRN
  Administered 2019-04-01: 4 mg via INTRAVENOUS

## 2019-04-01 MED ORDER — BUPIVACAINE-EPINEPHRINE 0.5% -1:200000 IJ SOLN
INTRAMUSCULAR | Status: DC | PRN
  Administered 2019-04-01: 35 mL

## 2019-04-01 MED ORDER — LIDOCAINE 2% (20 MG/ML) 5 ML SYRINGE
INTRAMUSCULAR | Status: AC
  Filled 2019-04-01: qty 5

## 2019-04-01 SURGICAL SUPPLY — 53 items
BENZOIN TINCTURE PRP APPL 2/3 (GAUZE/BANDAGES/DRESSINGS) ×2 IMPLANT
BLADE EXTENDED COATED 6.5IN (ELECTRODE) IMPLANT
BLADE HEX COATED 2.75 (ELECTRODE) ×2 IMPLANT
BLADE SURG 10 STRL SS (BLADE) IMPLANT
BRIEF STRETCH FOR OB PAD LRG (UNDERPADS AND DIAPERS) ×2 IMPLANT
CANISTER SUCT 3000ML PPV (MISCELLANEOUS) ×1 IMPLANT
COVER BACK TABLE 60X90IN (DRAPES) ×2 IMPLANT
COVER MAYO STAND STRL (DRAPES) ×2 IMPLANT
COVER WAND RF STERILE (DRAPES) ×4 IMPLANT
DRAPE HYSTEROSCOPY (DRAPE) IMPLANT
DRAPE LAPAROTOMY 100X72 PEDS (DRAPES) ×2 IMPLANT
DRAPE SHEET LG 3/4 BI-LAMINATE (DRAPES) IMPLANT
DRAPE UTILITY XL STRL (DRAPES) ×2 IMPLANT
DRSG PAD ABDOMINAL 8X10 ST (GAUZE/BANDAGES/DRESSINGS) ×2 IMPLANT
ELECT REM PT RETURN 9FT ADLT (ELECTROSURGICAL)
ELECTRODE REM PT RTRN 9FT ADLT (ELECTROSURGICAL) ×1 IMPLANT
GAUZE SPONGE 4X4 12PLY STRL (GAUZE/BANDAGES/DRESSINGS) IMPLANT
GAUZE SPONGE 4X4 12PLY STRL LF (GAUZE/BANDAGES/DRESSINGS) ×1 IMPLANT
GLOVE BIO SURGEON STRL SZ 6.5 (GLOVE) ×2 IMPLANT
GLOVE BIOGEL PI IND STRL 7.0 (GLOVE) ×1 IMPLANT
GLOVE BIOGEL PI INDICATOR 7.0 (GLOVE) ×1
GOWN STRL REUS W/TWL XL LVL3 (GOWN DISPOSABLE) ×2 IMPLANT
HYDROGEN PEROXIDE 16OZ (MISCELLANEOUS) IMPLANT
IV CATH 14GX2 1/4 (CATHETERS) IMPLANT
IV CATH 18G SAFETY (IV SOLUTION) IMPLANT
KIT SIGMOIDOSCOPE (SET/KITS/TRAYS/PACK) IMPLANT
KIT TURNOVER CYSTO (KITS) ×2 IMPLANT
LEGGING LITHOTOMY PAIR STRL (DRAPES) IMPLANT
LOOP VESSEL MAXI BLUE (MISCELLANEOUS) IMPLANT
NEEDLE HYPO 22GX1.5 SAFETY (NEEDLE) ×2 IMPLANT
NS IRRIG 500ML POUR BTL (IV SOLUTION) ×2 IMPLANT
PACK BASIN DAY SURGERY FS (CUSTOM PROCEDURE TRAY) ×2 IMPLANT
PAD ABD 8X10 STRL (GAUZE/BANDAGES/DRESSINGS) ×1 IMPLANT
PAD ARMBOARD 7.5X6 YLW CONV (MISCELLANEOUS) ×2 IMPLANT
PAD PREP 24X48 CUFFED NSTRL (MISCELLANEOUS) IMPLANT
PENCIL BUTTON HOLSTER BLD 10FT (ELECTRODE) ×1 IMPLANT
SPONGE HEMORRHOID 8X3CM (HEMOSTASIS) IMPLANT
SPONGE SURGIFOAM ABS GEL 100 (HEMOSTASIS) IMPLANT
SPONGE SURGIFOAM ABS GEL 12-7 (HEMOSTASIS) IMPLANT
SUT CHROMIC 2 0 SH (SUTURE) IMPLANT
SUT CHROMIC 3 0 SH 27 (SUTURE) IMPLANT
SUT ETHIBOND 0 (SUTURE) IMPLANT
SUT VIC AB 2-0 SH 27 (SUTURE)
SUT VIC AB 2-0 SH 27XBRD (SUTURE) IMPLANT
SUT VIC AB 3-0 SH 27 (SUTURE)
SUT VIC AB 3-0 SH 27XBRD (SUTURE) IMPLANT
SYR 10ML LL (SYRINGE) IMPLANT
SYR CONTROL 10ML LL (SYRINGE) ×2 IMPLANT
TOWEL OR 17X26 10 PK STRL BLUE (TOWEL DISPOSABLE) ×2 IMPLANT
TRAY DSU PREP LF (CUSTOM PROCEDURE TRAY) ×2 IMPLANT
TUBE CONNECTING 12X1/4 (SUCTIONS) ×2 IMPLANT
UNDERPAD 30X30 (UNDERPADS AND DIAPERS) ×2 IMPLANT
YANKAUER SUCT BULB TIP NO VENT (SUCTIONS) ×2 IMPLANT

## 2019-04-01 NOTE — Op Note (Signed)
04/01/2019  8:54 AM  PATIENT:  Bryan Cortez  48 y.o. male  Patient Care Team: Brita Romp, Dionne Bucy, MD as PCP - General (Family Medicine)  PRE-OPERATIVE DIAGNOSIS:  LEVATOR ANI SYNDROME, RECURRENT ANAL FISSURES  POST-OPERATIVE DIAGNOSIS:  LEVATOR ANI SYNDROME, RECURRENT ANAL FISSURES  PROCEDURE:  ANAL EXAM UNDER ANESTHESIA, CHEMICAL SPHINCTEROTOMY   Surgeon(s): Leighton Ruff, MD  ASSISTANT: none   ANESTHESIA:   local and MAC  SPECIMEN:  No Specimen  DISPOSITION OF SPECIMEN:  N/A  COUNTS:  YES  PLAN OF CARE: Discharge to home after PACU  PATIENT DISPOSITION:  PACU - hemodynamically stable.  INDICATION: 48 y.o. M with recurrent anal fissures   OR FINDINGS: posterior midline anal fissure  DESCRIPTION: the patient was identified in the preoperative holding area and taken to the OR where they were laid on the operating room table.  MAC anesthesia was induced without difficulty. The patient was then positioned in prone jackknife position with buttocks gently taped apart.  The patient was then prepped and draped in usual sterile fashion.  SCDs were noted to be in place prior to the initiation of anesthesia. A surgical timeout was performed indicating the correct patient, procedure, positioning and need for preoperative antibiotics.  A rectal block was performed using Marcaine with epinephrine.    I began with a digital rectal exam.  The patient had obvious sphincter hypertension. I then placed a small Hill-Ferguson anoscope into the anal canal and evaluated this completely.  There was minimal hemorrhoid disease (grade 1).  I then injected 100 units of Botox into the intersphincteric space.  Approximately 30 of the 100 units was placed into the levators for additional relief.  The patient tolerated this well and was awakened from anesthesia and sent to the postanesthesia care unit in stable condition.  All counts were correct per operating room staff.

## 2019-04-01 NOTE — H&P (Signed)
The patient is a 48 year old male who presents with a complaint of anal problems. 48 year old male who presents to the office with complaints of anal pain for approximately one year. He states that this started after developing diarrhea and this causing an anal fissure. He was treated medically for this and his fissure did improve. He states that over the past 12 months he has had waxing and waning symptoms of anal pain. This is mostly pain externally. He describes the pain mostly several hours after bowel movements. He denies any sharp pain with bowel movements. He is having soft stools after changing some of his diet. He is using rectiv ointment at night and nefedapime ointment in the morning. He has been doing this for several months. He is using cortisone suppositories as needed. I saw him a proximal Lee 4 months ago. We referred him for pelvic floor physical therapy. They were unable to do an in person consultation due to covid restrictions and therefore he was given home exercises. He has been doing these and noticed some relief of his rectal spasms, but continues to have quite a bit of burning pain after bowel movements.   Problem List/Past Medical Leighton Ruff, MD; Q000111Q 5:10 PM) PELVIC FLOOR TENSION (N34.3)  Past Surgical History Leighton Ruff, MD; [id number] 5:10 PM) Vasectomy  Diagnostic Studies History Leighton Ruff, MD; [id number] 5:10 PM) Colonoscopy never  Allergies (Tanisha A. Owens Shark, [place]; 02/23/2019 4:38 PM) Naprosyn *ANALGESICS - ANTI-INFLAMMATORY* Allergies Reconciled  Medication History (Tanisha A. Owens Shark, [place]; 02/23/2019 4:38 PM) hydroCHLOROthiazide (25MG  Tablet, Oral) Active. Anucort-HC (25MG  Suppository, Rectal) Active. Lisinopril (20MG  Tablet, Oral) Active. Medications Reconciled  Social History Leighton Ruff, MD; Q000111Q 5:10 PM) Alcohol use Occasional alcohol use. Caffeine use Carbonated beverages, Tea. Illicit drug use  Remotely quit drug use. Tobacco use Never smoker.  Family History Leighton Ruff, MD; Q000111Q 5:10 PM) Alcohol Abuse Father. Arthritis Sister. Cancer Mother. Depression Brother. Heart Disease Father. Hypertension Father. Migraine Headache Brother.  Other Problems Leighton Ruff, MD; Q000111Q 5:10 PM) High blood pressure Migraine Headache     Review of Systems General Not Present- Appetite Loss, Chills, Fatigue, Fever, Night Sweats, Weight Gain and Weight Loss. Skin Not Present- Change in Wart/Mole, Dryness, Hives, Jaundice, New Lesions, Non-Healing Wounds, Rash and Ulcer. HEENT Present- Ringing in the Ears and Seasonal Allergies. Not Present- Earache, Hearing Loss, Hoarseness, Nose Bleed, Oral Ulcers, Sinus Pain, Sore Throat, Visual Disturbances, Wears glasses/contact lenses and Yellow Eyes. Respiratory Not Present- Bloody sputum, Chronic Cough, Difficulty Breathing, Snoring and Wheezing. Breast Not Present- Breast Mass, Breast Pain, Nipple Discharge and Skin Changes. Cardiovascular Not Present- Chest Pain, Difficulty Breathing Lying Down, Leg Cramps, Palpitations, Rapid Heart Rate, Shortness of Breath and Swelling of Extremities. Gastrointestinal Present- Rectal Pain. Not Present- Abdominal Pain, Bloating, Bloody Stool, Change in Bowel Habits, Chronic diarrhea, Constipation, Difficulty Swallowing, Excessive gas, Gets full quickly at meals, Hemorrhoids, Indigestion, Nausea and Vomiting. Male Genitourinary Not Present- Blood in Urine, Change in Urinary Stream, Frequency, Impotence, Nocturia, Painful Urination, Urgency and Urine Leakage. Musculoskeletal Not Present- Back Pain, Joint Pain, Joint Stiffness, Muscle Pain, Muscle Weakness and Swelling of Extremities. Neurological Not Present- Decreased Memory, Fainting, Headaches, Numbness, Seizures, Tingling, Tremor, Trouble walking and Weakness. Psychiatric Not Present- Anxiety, Bipolar, Change in Sleep Pattern,  Depression, Fearful and Frequent crying. Endocrine Not Present- Cold Intolerance, Excessive Hunger, Hair Changes, Heat Intolerance and New Diabetes. Hematology Not Present- Blood Thinners, Easy Bruising, Excessive bleeding, Gland problems, HIV and Persistent Infections.  BP (!) 139/98  Pulse (!) 50   Temp 98.1 F (36.7 C) (Oral)   Resp 15   Ht 6' (1.829 m)   Wt 102.6 kg   SpO2 100%   BMI 30.67 kg/m       Physical Exam   General Mental Status-Alert. General Appearance-Not in acute distress. Build & Nutrition-Well nourished. Posture-Normal posture. Gait-Normal.  Head and Neck Head-normocephalic, atraumatic with no lesions or palpable masses. Trachea-midline.  Chest and Lung Exam Chest and lung exam reveals -on auscultation, normal breath sounds, no adventitious sounds and normal vocal resonance.  Cardiovascular Cardiovascular examination reveals -normal heart sounds, regular rate and rhythm with no murmurs and femoral artery auscultation bilaterally reveals normal pulses, no bruits, no thrills.  Abdomen Inspection Inspection of the abdomen reveals - No Hernias. Palpation/Percussion Palpation and Percussion of the abdomen reveal - Soft, Non Tender, No Rigidity (guarding), No hepatosplenomegaly and No Palpable abdominal masses.  Rectal Anorectal Exam External - normal external exam. Internal - normal sphincter tone. Note: no TTP bilateral puborectalis, less spasm noted  Neurologic Neurologic evaluation reveals -alert and oriented x 3 with no impairment of recent or remote memory, normal attention span and ability to concentrate, normal sensation and normal coordination.  Musculoskeletal Normal Exam - Bilateral-Upper Extremity Strength Normal and Lower Extremity Strength Normal.    Assessment & Plan   PELVIC FLOOR TENSION (N34.3) Impression: Patient has completed pelvic floor physical therapy at home, but did not receive  any biofeedback treatment or in person evaluation. On exam today, he does have quite a bit less spasm, but continues to have burning pain after bowel movements. Given these findings, consistent with levator ani syndrome, I have recommended Botox pelvic floor injections. Hopefully, this will reset his muscle tone and allow him to defecate without pain. We discussed that this would be a temporary treatment. I think he has a good chance that success, as he has seemed to treat the underlying cause as well. We will also evaluate his anal canal for possible hemorrhoid disease that may be causing some of his symptoms.

## 2019-04-01 NOTE — Transfer of Care (Signed)
Immediate Anesthesia Transfer of Care Note  Patient: Bryan Cortez  Procedure(s) Performed: ANAL EXAM UNDER ANESTHESIA, CHEMICAL SPHINCTEROTOMY (N/A Rectum)  Patient Location: PACU  Anesthesia Type:MAC  Level of Consciousness: awake, alert , oriented and patient cooperative  Airway & Oxygen Therapy: Patient Spontanous Breathing and Patient connected to nasal cannula oxygen  Post-op Assessment: Report given to RN and Post -op Vital signs reviewed and stable  Post vital signs: Reviewed and stable  Last Vitals:  Vitals Value Taken Time  BP    Temp    Pulse 73 04/01/19 0905  Resp 18 04/01/19 0905  SpO2 96 % 04/01/19 0905  Vitals shown include unvalidated device data.  Last Pain:  Vitals:   04/01/19 0737  TempSrc: Oral  PainSc: 0-No pain      Patients Stated Pain Goal: 5 (63/89/37 3428)  Complications: No apparent anesthesia complications

## 2019-04-01 NOTE — Discharge Instructions (Addendum)

## 2019-04-01 NOTE — Anesthesia Postprocedure Evaluation (Signed)
Anesthesia Post Note  Patient: Bryan Cortez  Procedure(s) Performed: ANAL EXAM UNDER ANESTHESIA, CHEMICAL SPHINCTEROTOMY (N/A Rectum)     Patient location during evaluation: Phase II Anesthesia Type: MAC Level of consciousness: awake Pain management: pain level controlled Vital Signs Assessment: post-procedure vital signs reviewed and stable Respiratory status: spontaneous breathing Cardiovascular status: stable Postop Assessment: no apparent nausea or vomiting Anesthetic complications: no    Last Vitals:  Vitals:   04/01/19 0915 04/01/19 0930  BP: (!) 145/78 (!) 143/84  Pulse: 72 62  Resp: 12 11  Temp:    SpO2: 96% 96%    Last Pain:  Vitals:   04/01/19 0930  TempSrc:   PainSc: 0-No pain   Pain Goal: Patients Stated Pain Goal: 5 (04/01/19 0737)                 Huston Foley

## 2019-05-21 ENCOUNTER — Encounter: Payer: Self-pay | Admitting: Gastroenterology

## 2019-05-21 ENCOUNTER — Ambulatory Visit: Payer: Federal, State, Local not specified - PPO | Admitting: Gastroenterology

## 2019-05-21 ENCOUNTER — Other Ambulatory Visit: Payer: Self-pay

## 2019-05-21 VITALS — BP 158/98 | HR 85 | Temp 97.6°F | Ht 72.0 in | Wt 229.6 lb

## 2019-05-21 DIAGNOSIS — K602 Anal fissure, unspecified: Secondary | ICD-10-CM | POA: Diagnosis not present

## 2019-05-21 DIAGNOSIS — K6289 Other specified diseases of anus and rectum: Secondary | ICD-10-CM | POA: Diagnosis not present

## 2019-05-21 NOTE — Progress Notes (Addendum)
Gastroenterology Consultation  Referring Provider:     Virginia Crews, MD Primary Care Physician:  Virginia Crews, MD Primary Gastroenterologist:  Dr. Allen Norris     Reason for Consultation:     " To discuss GI issues"         HPI:   Bryan Cortez is a 49 y.o. y/o male referred for consultation & management of "to discuss GI issues" by Dr. Brita Romp, Dionne Bucy, MD.  This patient comes in today after having a colonoscopy by me back in September 2020.  At that time the patient had a polyp removed and the procedure was being done for screening.  The patient was recommended to have a repeat colonoscopy in 5 years.  In December 2020 the patient had injection of Botox for recurrent anal fissures. The patient reports that he has had Botox injection into his rectum and since then he has not felt any better and feels like there is a bump in his rectal area.  He states that he was seen by the surgeon for follow-up twice and does not have any real resolution to his constant rectal pain and inability to move his bowels.  There is no report of any rectal bleeding.  The patient does report that he has had recurrent anal fissures.  He has not been spoken to about anal myotomy.  Past Medical History:  Diagnosis Date  . Arthritis    right ankle  . Dental bridge present    Top front  . Hypertension   . Kidney stone   . Motion sickness    boats    Past Surgical History:  Procedure Laterality Date  . COLONOSCOPY WITH PROPOFOL N/A 01/23/2019   Procedure: COLONOSCOPY WITH BIOPSIES;  Surgeon: Lucilla Lame, MD;  Location: [facility];  Service: Endoscopy;  Laterality: N/A;  . POLYPECTOMY N/A 01/23/2019   Procedure: POLYPECTOMY;  Surgeon: Lucilla Lame, MD;  Location: [facility];  Service: Endoscopy;  Laterality: N/A;  . TESTICLE SURGERY Bilateral 1974   undistended    Prior to Admission medications   Medication Sig Start Date End Date Taking? Authorizing Provider    hydrochlorothiazide (HYDRODIURIL) 25 MG tablet Take 1 tablet (25 mg total) by mouth daily. 03/09/19   Bacigalupo, Dionne Bucy, MD  lisinopril (ZESTRIL) 20 MG tablet Take 1 tablet (20 mg total) by mouth daily. 01/13/19   Bacigalupo, Dionne Bucy, MD  traMADol (ULTRAM) 50 MG tablet Take 1 tablet (50 mg total) by mouth every 6 (six) hours as needed. [id number]   Leighton Ruff, MD    Family History  Problem Relation Age of Onset  . Bladder Cancer Father   . Heart disease Father   . Cancer Father        bladder  . Dementia Father   . Kidney Stones Sister   . Obesity Sister   . Other Sister        prediabetes  . Cancer Mother        unknown CA  . Anxiety disorder Brother   . Kidney cancer Neg Hx   . Prostate cancer Neg Hx      Social History   Tobacco Use  . Smoking status: Never Smoker  . Smokeless tobacco: Never Used  Substance Use Topics  . Alcohol use: No    Comment: rarely  . Drug use: No    Allergies as of 05/21/2019 - Review Complete 04/01/2019  Allergen Reaction Noted  . Naproxen Hives 11/22/2016  Review of Systems:    All systems reviewed and negative except where noted in HPI.   Physical Exam:  There were no vitals taken for this visit. No LMP for male patient. General:   Alert,  Well-developed, well-nourished, pleasant and cooperative in NAD Head:  Normocephalic and atraumatic. Rectal: Rectal exam showed a tender small hemorrhoid at the 12 o'clock position the sphincter tone was quite high with the patient not tolerating insertion of the a digit due to the pain and anal tone.  Extremities:  No clubbing or edema.  No cyanosis. Neurologic:  Alert and oriented x3;  grossly normal neurologically. Skin:  Intact without significant lesions or rashes.  No jaundice. Psych:  Alert and cooperative. Normal mood and affect.  Imaging Studies: No results found.  Assessment and Plan:   Bryan Cortez is a 49 y.o. y/o male who comes in today with continued rectal pain despite  Botox injection and recurrent fissures.  The patient is not really happy with the outcome of his Botox injections and is asking me for recommendation for what to do now.  The patient has a very high anal sphincter tone.  He has been recommended to see a surgeon for possible anal myotomy.  I have recommended that he see Dr. Nadeen Landau in [place] as a second opinion.  The patient has been explained the plan and agrees with it.    Lucilla Lame, MD. Marval Regal    Note: This dictation was prepared with Dragon dictation along with smaller phrase technology. Any transcriptional errors that result from this process are unintentional.

## 2019-05-23 ENCOUNTER — Encounter: Payer: Self-pay | Admitting: Family Medicine

## 2019-05-26 ENCOUNTER — Ambulatory Visit (INDEPENDENT_AMBULATORY_CARE_PROVIDER_SITE_OTHER): Payer: Federal, State, Local not specified - PPO | Admitting: Vascular Surgery

## 2019-05-26 ENCOUNTER — Other Ambulatory Visit: Payer: Self-pay

## 2019-05-26 VITALS — BP 145/88 | HR 55 | Resp 14 | Ht 72.0 in | Wt 228.0 lb

## 2019-05-26 DIAGNOSIS — I10 Essential (primary) hypertension: Secondary | ICD-10-CM

## 2019-05-26 DIAGNOSIS — I83812 Varicose veins of left lower extremities with pain: Secondary | ICD-10-CM

## 2019-05-26 NOTE — Assessment & Plan Note (Signed)
Recommend  I have reviewed my previous  discussion with the patient regarding  varicose veins and why they cause symptoms. Patient will continue  wearing graduated compression stockings class 1 on a daily basis, beginning first thing in the morning and removing them in the evening.    In addition, behavioral modification including elevation during the day was again discussed and this will continue.  The patient has utilized over the counter pain medications and has been exercising.  However, at this time conservative therapy has not alleviated the patient's symptoms of leg pain and swelling  Recommend: laser ablation of the left great saphenous veins to eliminate the symptoms of pain and swelling of the lower extremities caused by the severe superficial venous reflux disease.

## 2019-05-26 NOTE — Progress Notes (Signed)
MRN : [id number]  Bryan Cortez is a 49 y.o. (19-Aug-1970) male who presents with chief complaint of  Chief Complaint  Patient presents with  . Follow-up    3 Mo Follow up  .  History of Present Illness: Patient returns today in follow up of his venous insufficiency.  He continues to have pain and swelling in the left lower extremity as a result of his significant left great saphenous vein reflux and large varicosities.  He has been diligently wearing his 20 to 30 mmHg compression stockings, elevating his legs, and using anti-inflammatories for the discomfort for over 3 months now.  Minimal improvement in his symptoms.  Current Outpatient Medications  Medication Sig Dispense Refill  . ANUCORT-HC 25 MG suppository Place 25 mg rectally 2 (two) times daily.    . hydrochlorothiazide (HYDRODIURIL) 25 MG tablet Take 1 tablet (25 mg total) by mouth daily. 90 tablet 2  . lisinopril (ZESTRIL) 20 MG tablet Take 1 tablet (20 mg total) by mouth daily. 30 tablet 3  . traMADol (ULTRAM) 50 MG tablet Take 1 tablet (50 mg total) by mouth every 6 (six) hours as needed. (Patient not taking: Reported on 05/21/2019) 10 tablet 0   No current facility-administered medications for this visit.    Past Medical History:  Diagnosis Date  . Arthritis    right ankle  . Dental bridge present    Top front  . Hypertension   . Kidney stone   . Motion sickness    boats    Past Surgical History:  Procedure Laterality Date  . COLONOSCOPY WITH PROPOFOL N/A 01/23/2019   Procedure: COLONOSCOPY WITH BIOPSIES;  Surgeon: Lucilla Lame, MD;  Location: [facility];  Service: Endoscopy;  Laterality: N/A;  . POLYPECTOMY N/A 01/23/2019   Procedure: POLYPECTOMY;  Surgeon: Lucilla Lame, MD;  Location: [facility];  Service: Endoscopy;  Laterality: N/A;  . TESTICLE SURGERY Bilateral 1974   undistended     Social History   Tobacco Use  . Smoking status: Never Smoker  . Smokeless tobacco: Never Used   Substance Use Topics  . Alcohol use: No    Comment: rarely  . Drug use: No    Family History  Problem Relation Age of Onset  . Bladder Cancer Father   . Heart disease Father   . Cancer Father        bladder  . Dementia Father   . Kidney Stones Sister   . Obesity Sister   . Other Sister        prediabetes  . Cancer Mother        unknown CA  . Anxiety disorder Brother   . Kidney cancer Neg Hx   . Prostate cancer Neg Hx     Allergies  Allergen Reactions  . Naproxen Hives     REVIEW OF SYSTEMS (Negative unless checked) Review of Systems: Negative Unless Checked Constitutional: [] ?Weight loss[] ?Fever[] ?Chills Cardiac:[] ?Chest pain[] ? Atrial Fibrillation[] ?Palpitations [] ?Shortness of breath when laying flat [] ?Shortness of breath with exertion. [] ?Shortness of breath at rest Vascular: [] ?Pain in legs with walking[] ?Pain in legswith standing[] ?Pain in legs when laying flat [] ?Claudication  [] ?Pain in feet when laying flat [] ?History of DVT [] ?Phlebitis [x] ?Swelling in legs [x] ?Varicose veins [] ?Non-healing ulcers Pulmonary: [] ?Uses home oxygen [] ?Productive cough[] ?Hemoptysis [] ?Wheeze [] ?COPD [] ?Asthma Neurologic: [] ?Dizziness[] ?Seizures [] ?Blackouts[] ?History of stroke [] ?History of TIA[] ?Aphasia [] ?Temporary Blindness[] ?Weaknessor numbness in arm [] ?Weakness or numbnessin leg Musculoskeletal:[] ?Joint swelling [] ?Joint pain [] ?Low back pain  [] ? History of Knee Replacement [] ?Arthritis [] ?back Surgeries[] ? Spinal Stenosis  Hematologic:[] ?Easy bruising[] ?Easy bleeding [] ?Hypercoagulable state [] ?Anemic Gastrointestinal:[] ?Diarrhea [] ?Vomiting[] ?Gastroesophageal reflux/heartburn[] ?Difficulty swallowing. [] ?Abdominal pain Genitourinary: [] ?Chronic kidney disease [] ?Difficulturination [] ?Anuric[] ?Blood in urine [] ?Frequenturination [] ?Burning with urination[] ?Hematuria Skin:  [] ?Rashes [] ?Ulcers [] ?Wounds Psychological: [] ?History of anxiety[] ?History of major depression  [] ? Memory Difficulties  Physical Examination  BP (!) 145/88 (BP Location: Right Arm)   Pulse (!) 55   Resp 14   Ht 6' (1.829 m)   Wt 228 lb (103.4 kg)   BMI 30.92 kg/m  Gen:  WD/WN, NAD Head: [place]/AT, No temporalis wasting. Ear/Nose/Throat: Hearing grossly intact, nares w/o erythema or drainage Eyes: Conjunctiva clear. Sclera non-icteric Neck: Supple.  Trachea midline Pulmonary:  Good air movement, no use of accessory muscles.  Cardiac: RRR, no JVD Vascular: Fairly extensive varicosities in the left calf both posteriorly, medially, and anteriorly.  These measure 3 to 4 mm in diameter at the larger size. Vessel Right Left  Radial Palpable Palpable                   Musculoskeletal: M/S 5/5 throughout.  No deformity or atrophy.  Trace left lower extremity edema. Neurologic: Sensation grossly intact in extremities.  Symmetrical.  Speech is fluent.  Psychiatric: Judgment intact, Mood & affect appropriate for pt's clinical situation. Dermatologic: No rashes or ulcers noted.  No cellulitis or open wounds.       Labs Recent Results (from the past 2160 hour(s))  Novel Coronavirus, NAA (Hosp order, Send-out to Ref Lab; TAT 18-24 hrs     Status: None   Collection Time: 03/28/19  9:02 AM   Specimen: Nasopharyngeal Swab; Respiratory  Result Value Ref Range   SARS-CoV-2, NAA NOT DETECTED NOT DETECTED    Comment: (NOTE) This nucleic acid amplification test was developed and its performance characteristics determined by [employer]. Nucleic acid amplification tests include PCR and TMA. This test has not been FDA cleared or approved. This test has been authorized by FDA under an Emergency Use Authorization (EUA). This test is only authorized for the duration of time the declaration that circumstances exist justifying the authorization of the emergency use of in vitro  diagnostic tests for detection of SARS-CoV-2 virus and/or diagnosis of COVID-19 infection under section 564(b)(1) of the Act, 21 U.S.C. [id number]) (1), unless the authorization is terminated or revoked sooner. When diagnostic testing is negative, the possibility of a false negative result should be considered in the context of a patient's recent exposures and the presence of clinical signs and symptoms consistent with COVID-19. An individual without symptoms of COVID- 19 and who is not shedding SARS-CoV-2 vi rus would expect to have a negative (not detected) result in this assay. Performed At: [facility] [place], [place] [id number] Rush Farmer MD [id number]    Coronavirus Source NASOPHARYNGEAL     Comment: Performed at [facility], [place] [address], [place], [place] [address]  I-STAT, [place] [address]     Status: Abnormal   Collection Time: 04/01/19  7:56 AM  Result Value Ref Range   Sodium 141 135 - 145 mmol/L   Potassium 3.7 3.5 - 5.1 mmol/L   Chloride 103 98 - 111 mmol/L   BUN 21 (H) 6 - 20 mg/dL   Creatinine, Ser 0.90 0.61 - 1.24 mg/dL   Glucose, Bld 97 70 - 99 mg/dL   Calcium, Ion 1.26 1.15 - 1.40 mmol/L   TCO2 25 22 - 32 mmol/L   Hemoglobin 15.3 13.0 - 17.0 g/dL   HCT 45.0 39.0 - 52.0 %  Radiology No results found.  Assessment/Plan  Hypertension blood pressure control important in reducing the progression of atherosclerotic disease. On appropriate oral medications.   Varicose veins of left lower extremity with pain Recommend  I have reviewed my previous  discussion with the patient regarding  varicose veins and why they cause symptoms. Patient will continue  wearing graduated compression stockings class 1 on a daily basis, beginning first thing in the morning and removing them in the evening.    In addition, behavioral modification including elevation during the day was again discussed and this will continue.  The patient has utilized  over the counter pain medications and has been exercising.  However, at this time conservative therapy has not alleviated the patient's symptoms of leg pain and swelling  Recommend: laser ablation of the left great saphenous veins to eliminate the symptoms of pain and swelling of the lower extremities caused by the severe superficial venous reflux disease.     Leotis Pain, MD  05/26/2019 9:27 AM    This note was created with Dragon medical transcription system.  Any errors from dictation are purely unintentional

## 2019-05-26 NOTE — Patient Instructions (Signed)
Nonsurgical Procedures for Varicose Veins, Care After This sheet gives you information about how to care for yourself after your procedure. Your health care provider may also give you more specific instructions. If you have problems or questions, contact your health care provider. What can I expect after the procedure? After the procedure, it is common to have:  Swelling.  Bruising.  Soreness.  Mild skin discoloration.  Slight bleeding at the incision sites. Follow these instructions at home: Incision or puncture site care  Follow instructions from your health care provider about how to take care of your incision or puncture site. Make sure you: ? Wash your hands with soap and water before you change your bandage (dressing). If soap and water are not available, use hand sanitizer. ? Change your dressing as told by your health care provider. ? Leave skin glue or adhesive strips in place. These skin closures may need to stay in place for 2 weeks or longer. If adhesive strip edges start to loosen and curl up, you may trim the loose edges. Do not remove adhesive strips completely unless your health care provider tells you to do that.  Check your incision or puncture area every day for signs of infection. Check for: ? Redness, swelling, or pain. ? Fluid or blood. ? Warmth. ? Pus or a bad smell. General instructions   Take over-the-counter and prescription medicines only as told by your health care provider.  Wear compression stockings as told by your health care provider. These stockings help to prevent blood clots and reduce swelling in your legs.  Do not take baths, swim, or use a hot tub until your health care provider approves. Ask your health care provider if you can take showers.  Wear loose-fitting clothing.  Return to your normal activities as told by your health care provider. Ask your health care provider what activities are safe for you.  Get regular daily exercise. Walk  or ride a stationary bike daily or as told by your health care provider.  Keep all follow-up visits as told by your health care provider. This is important. Contact a health care provider if:  You have a fever.  You have redness, swelling, or pain around your incision or puncture site.  You have fluid or blood coming from your incision or puncture site.  Your incision or puncture site feels warm to the touch.  You have pus or a bad smell coming from your incision or puncture site.  You develop a cough. Get help right away if:  You pass out.  You have very bad pain in your leg.  You have leg pain that gets worse when you walk.  You have redness or swelling in your leg that is getting worse.  You have trouble breathing.  You cough up blood. Summary  After the procedure, it is common to have swelling, bruising, soreness, or mild skin discoloration.  Follow instructions from your health care provider about how to take care of your incision or puncture site.  Wear compression stockings as told by your health care provider. These stockings help to prevent blood clots and reduce swelling in your legs. This information is not intended to replace advice given to you by your health care provider. Make sure you discuss any questions you have with your health care provider. Document Revised: 03/29/2017 Document Reviewed: 07/27/2016 Elsevier Patient Education  2020 Elsevier Inc.  

## 2019-05-26 NOTE — Assessment & Plan Note (Signed)
blood pressure control important in reducing the progression of atherosclerotic disease. On appropriate oral medications.  

## 2019-06-02 ENCOUNTER — Encounter: Payer: Self-pay | Admitting: Family Medicine

## 2019-06-02 NOTE — Telephone Encounter (Signed)
Please place referral to [facility] as requested

## 2019-06-03 ENCOUNTER — Telehealth: Payer: Self-pay

## 2019-06-03 DIAGNOSIS — K602 Anal fissure, unspecified: Secondary | ICD-10-CM

## 2019-06-03 DIAGNOSIS — K6289 Other specified diseases of anus and rectum: Secondary | ICD-10-CM

## 2019-06-03 NOTE — Telephone Encounter (Signed)
Referral placed to [facility].

## 2019-06-26 ENCOUNTER — Encounter: Payer: Self-pay | Admitting: Family Medicine

## 2019-06-29 ENCOUNTER — Telehealth: Payer: Self-pay

## 2019-06-29 NOTE — Telephone Encounter (Signed)
Copied from [place] [id number]. Topic: Appointment Scheduling - Scheduling Inquiry for Clinic >> Jun 29, 2019  5:32 PM Bryan Cortez wrote: Reason for CRM: patient is needing an appt for tomorrow / no availability . Please advise

## 2019-06-30 ENCOUNTER — Ambulatory Visit: Payer: Federal, State, Local not specified - PPO | Admitting: Family Medicine

## 2019-06-30 ENCOUNTER — Other Ambulatory Visit: Payer: Self-pay

## 2019-06-30 ENCOUNTER — Encounter: Payer: Self-pay | Admitting: Family Medicine

## 2019-06-30 VITALS — BP 128/86 | HR 62 | Temp 96.9°F | Wt 227.0 lb

## 2019-06-30 DIAGNOSIS — K6289 Other specified diseases of anus and rectum: Secondary | ICD-10-CM

## 2019-06-30 DIAGNOSIS — K602 Anal fissure, unspecified: Secondary | ICD-10-CM | POA: Diagnosis not present

## 2019-06-30 NOTE — Progress Notes (Signed)
Bryan Cortez  MRN: [id number] DOB: 12-Dec-1970  Subjective:  HPI   The patient is a 49 year old male who presents for evaluation of anal abcess.  Patient has history of fissure since 2019.  In 12/20 he had procedure with Botox injection.  He has had a lump and pain since the procedure.  In the last week to 10 days the abcess has been draining. He has an appointment with a new doctor to get a second opinion.     Patient Active Problem List   Diagnosis Date Noted  . Special screening for malignant neoplasms, colon   . Polyp of transverse colon   . Varicose veins of left lower extremity with pain 01/13/2019  . Chronic idiopathic anal pain 09/10/2018  . Anal fissure 01/02/2018  . Hypertension 12/12/2016  . Obesity 12/12/2016  . Allergic rhinitis 11/24/2015  . Frequent headaches 11/24/2015    Past Medical History:  Diagnosis Date  . Arthritis    right ankle  . Dental bridge present    Top front  . Hypertension   . Kidney stone   . Motion sickness    boats   Past Surgical History:  Procedure Laterality Date  . COLONOSCOPY WITH PROPOFOL N/A 01/23/2019   Procedure: COLONOSCOPY WITH BIOPSIES;  Surgeon: Lucilla Lame, MD;  Location: [place];  Service: Endoscopy;  Laterality: N/A;  . POLYPECTOMY N/A 01/23/2019   Procedure: POLYPECTOMY;  Surgeon: Lucilla Lame, MD;  Location: [place];  Service: Endoscopy;  Laterality: N/A;  . TESTICLE SURGERY Bilateral 1974   undistended   Family History  Problem Relation Age of Onset  . Bladder Cancer Father   . Heart disease Father   . Cancer Father        bladder  . Dementia Father   . Kidney Stones Sister   . Obesity Sister   . Other Sister        prediabetes  . Cancer Mother        unknown CA  . Anxiety disorder Brother   . Kidney cancer Neg Hx   . Prostate cancer Neg Hx    Social History   Socioeconomic History  . Marital status: Married    Spouse name: Tallan Hiers  . Number of children: 2  . Years  of education: some college  . Highest education level: Not on file  Occupational History    Employer: [name]  Tobacco Use  . Smoking status: Never Smoker  . Smokeless tobacco: Never Used  Substance and Sexual Activity  . Alcohol use: No    Comment: rarely  . Drug use: No  . Sexual activity: Yes  Other Topics Concern  . Not on file  Social History Narrative  . Not on file   Social Determinants of Health   Financial Resource Strain:   . Difficulty of Paying Living Expenses: Not on file  Food Insecurity:   . Worried About Charity fundraiser in the Last Year: Not on file  . Ran Out of Food in the Last Year: Not on file  Transportation Needs:   . Lack of Transportation (Medical): Not on file  . Lack of Transportation (Non-Medical): Not on file  Physical Activity:   . Days of Exercise per Week: Not on file  . Minutes of Exercise per Session: Not on file  Stress:   . Feeling of Stress : Not on file  Social Connections:   . Frequency of Communication with Friends and Family:  Not on file  . Frequency of Social Gatherings with Friends and Family: Not on file  . Attends Religious Services: Not on file  . Active Member of Clubs or Organizations: Not on file  . Attends Archivist Meetings: Not on file  . Marital Status: Not on file  Intimate Partner Violence:   . Fear of Current or Ex-Partner: Not on file  . Emotionally Abused: Not on file  . Physically Abused: Not on file  . Sexually Abused: Not on file    Outpatient Encounter Medications as of 06/30/2019  Medication Sig  . hydrochlorothiazide (HYDRODIURIL) 25 MG tablet Take 1 tablet (25 mg total) by mouth daily.  Marland Kitchen lisinopril (ZESTRIL) 20 MG tablet Take 1 tablet (20 mg total) by mouth daily.  . ANUCORT-HC 25 MG suppository Place 25 mg rectally 2 (two) times daily.  . traMADol (ULTRAM) 50 MG tablet Take 1 tablet (50 mg total) by mouth every 6 (six) hours as needed. (Patient not taking: Reported on 05/21/2019)    No facility-administered encounter medications on file as of 06/30/2019.    Allergies  Allergen Reactions  . Naproxen Hives    Review of Systems  Constitutional: Negative for chills, diaphoresis, fever and malaise/fatigue.  HENT: Negative for congestion, ear pain and sore throat.   Respiratory: Negative for cough and shortness of breath.   Cardiovascular: Negative for chest pain and palpitations.  Gastrointestinal: Negative for abdominal pain, blood in stool, constipation, diarrhea, heartburn, nausea and vomiting.       Drainage from anal abcess    Objective:  BP 128/86 (BP Location: Right Arm, Patient Position: Sitting, Cuff Size: Normal)   Pulse 62   Temp (!) 96.9 F (36.1 C) (Skin)   Wt 227 lb (103 kg)   SpO2 98%   BMI 30.79 kg/m   Physical Exam  Constitutional: He is oriented to person, place, and time and well-developed, well-nourished, and in no distress.  HENT:  Head: Normocephalic.  Eyes: Conjunctivae are normal.  Pulmonary/Chest: Effort normal.  Abdominal: Soft.  Genitourinary:    Genitourinary Comments: Anal fissure at 6 o'clock position of anus with some surrounding scar. No sign of infection or external hemorrhoids. No bleeding or skin tags.   Musculoskeletal:        General: Normal range of motion.     Cervical back: Neck supple.  Neurological: He is alert and oriented to person, place, and time.  Skin: No rash noted.  Psychiatric: Mood, affect and judgment normal.    Assessment and Plan :   1. Anal fissure Chronic anal fissure at 6 o'clock position without bleeding and scarring around fissure. Had had multiple topical and Botox treatments since diagnosis in 2019. No sign of infection, bleeding or thrombosed hemorrhoid today. Lump sensation suspected secondary to scar tissue around the fissure. Has an appointment for a second opinion on 07-06-19 at [facility] with Dr. Maxie Better (colorectal surgeon).  2. Pain, anal Chronic pain that usually occurs 1-2 hours after  defecation. No bleeding and no relief from Botox injection in Dec. 2020. Sphincter spasm suspected due to anal fissure. Some relief after using hot soaks and Ibuprofen each time. Only relief from hemorrhoid creams (cortisone or analgesic) has been for a few minutes. Takes a stool softener daily to limit hard stools. No sign of abscess or thrombosed hemorrhoid today. Agree he should keep the appointment with a colorectal surgeon to see if sphincterotomy needed. May continue present treatment regimen. Don't see a need for antibiotics.

## 2019-07-01 NOTE — Telephone Encounter (Signed)
Patient was seen by Simona Huh 06/30/2019.

## 2019-07-01 NOTE — Telephone Encounter (Signed)
940 and 140 are available today

## 2019-07-06 DIAGNOSIS — K6289 Other specified diseases of anus and rectum: Secondary | ICD-10-CM | POA: Diagnosis not present

## 2019-07-17 ENCOUNTER — Encounter (INDEPENDENT_AMBULATORY_CARE_PROVIDER_SITE_OTHER): Payer: Self-pay | Admitting: Vascular Surgery

## 2019-07-17 ENCOUNTER — Ambulatory Visit (INDEPENDENT_AMBULATORY_CARE_PROVIDER_SITE_OTHER): Payer: Federal, State, Local not specified - PPO | Admitting: Vascular Surgery

## 2019-07-17 ENCOUNTER — Other Ambulatory Visit: Payer: Self-pay

## 2019-07-17 VITALS — BP 123/82 | HR 82 | Ht 72.0 in | Wt 224.0 lb

## 2019-07-17 DIAGNOSIS — I83812 Varicose veins of left lower extremities with pain: Secondary | ICD-10-CM | POA: Diagnosis not present

## 2019-07-17 NOTE — Progress Notes (Signed)
  Bryan Cortez is a 49 y.o. male who presents with symptomatic venous reflux  Past Medical History:  Diagnosis Date  . Arthritis    right ankle  . Dental bridge present    Top front  . Hypertension   . Kidney stone   . Motion sickness    boats    Past Surgical History:  Procedure Laterality Date  . COLONOSCOPY WITH PROPOFOL N/A 01/23/2019   Procedure: COLONOSCOPY WITH BIOPSIES;  Surgeon: Lucilla Lame, MD;  Location: [place] [facility];  Service: Endoscopy;  Laterality: N/A;  . POLYPECTOMY N/A 01/23/2019   Procedure: POLYPECTOMY;  Surgeon: Lucilla Lame, MD;  Location: [place];  Service: Endoscopy;  Laterality: N/A;  . TESTICLE SURGERY Bilateral 1974   undistended     Current Outpatient Medications:  .  ALPRAZolam (XANAX) 0.5 MG tablet, TAKE 1 TABLET BY MOUTH ONE HOUR PRIOR TO PROCEDURE TAKE 2ND TABLET UPON ARRIVAL, Disp: , Rfl:  .  hydrochlorothiazide (HYDRODIURIL) 25 MG tablet, Take 1 tablet (25 mg total) by mouth daily., Disp: 90 tablet, Rfl: 2 .  lisinopril (ZESTRIL) 20 MG tablet, Take 1 tablet (20 mg total) by mouth daily., Disp: 30 tablet, Rfl: 3 .  ANUCORT-HC 25 MG suppository, Place 25 mg rectally 2 (two) times daily., Disp: , Rfl:  .  traMADol (ULTRAM) 50 MG tablet, Take 1 tablet (50 mg total) by mouth every 6 (six) hours as needed. (Patient not taking: Reported on 05/21/2019), Disp: 10 tablet, Rfl: 0  Allergies  Allergen Reactions  . Naproxen Hives     Varicose veins of left lower extremity with pain     PLAN: The patient's left lower extremity was sterilely prepped and draped. The ultrasound machine was used to visualize the saphenous vein throughout its course. A segment in the upper calf was selected for access. The saphenous vein was accessed with minimal difficulty using ultrasound guidance with a micropuncture needle. A 0.018 wire was then placed beyond the saphenofemoral junction and the needle was removed. The 65 cm sheath was then placed over the  wire and the wire and dilator were removed. The laser fiber was then placed through the sheath and its tip was placed approximately 4-5 centimeters below the saphenofemoral junction. Tumescent anesthesia was then created with a dilute lidocaine solution. Laser energy was then delivered with constant withdrawal of the sheath and laser fiber. Approximately 1451 joules of energy were delivered over a length of 37 centimeters using a 1470 Hz VenaCure machine at 7 W. Sterile dressings were placed. The patient tolerated the procedure well without obvious complications.   Follow-up in 1 week with post-laser duplex.

## 2019-07-20 ENCOUNTER — Ambulatory Visit (INDEPENDENT_AMBULATORY_CARE_PROVIDER_SITE_OTHER): Payer: Federal, State, Local not specified - PPO

## 2019-07-20 ENCOUNTER — Other Ambulatory Visit: Payer: Self-pay

## 2019-07-20 DIAGNOSIS — I83812 Varicose veins of left lower extremities with pain: Secondary | ICD-10-CM

## 2019-07-23 ENCOUNTER — Other Ambulatory Visit: Payer: Self-pay | Admitting: Family Medicine

## 2019-07-23 MED ORDER — LISINOPRIL 20 MG PO TABS: 20.0000 mg | ORAL_TABLET | Freq: Every day | ORAL | 3 refills | Status: DC

## 2019-07-23 NOTE — Telephone Encounter (Signed)
[facility] faxed refill request for the following medications: ° ° °lisinopril (ZESTRIL) 20 MG tablet ° ° °Please advise. ° °Thanks, °[facility] ° °

## 2019-08-03 DIAGNOSIS — Z01812 Encounter for preprocedural laboratory examination: Secondary | ICD-10-CM | POA: Diagnosis not present

## 2019-08-03 DIAGNOSIS — U071 COVID-19: Secondary | ICD-10-CM | POA: Diagnosis not present

## 2019-08-13 ENCOUNTER — Ambulatory Visit: Payer: Federal, State, Local not specified - PPO | Admitting: Family Medicine

## 2019-08-14 ENCOUNTER — Ambulatory Visit (INDEPENDENT_AMBULATORY_CARE_PROVIDER_SITE_OTHER): Payer: Federal, State, Local not specified - PPO | Admitting: Vascular Surgery

## 2019-08-28 ENCOUNTER — Other Ambulatory Visit: Payer: Self-pay

## 2019-08-28 ENCOUNTER — Encounter (INDEPENDENT_AMBULATORY_CARE_PROVIDER_SITE_OTHER): Payer: Self-pay | Admitting: Vascular Surgery

## 2019-08-28 ENCOUNTER — Ambulatory Visit (INDEPENDENT_AMBULATORY_CARE_PROVIDER_SITE_OTHER): Payer: Federal, State, Local not specified - PPO | Admitting: Vascular Surgery

## 2019-08-28 VITALS — BP 127/76 | HR 65 | Ht 72.0 in | Wt 220.0 lb

## 2019-08-28 DIAGNOSIS — I10 Essential (primary) hypertension: Secondary | ICD-10-CM | POA: Diagnosis not present

## 2019-08-28 DIAGNOSIS — I83812 Varicose veins of left lower extremities with pain: Secondary | ICD-10-CM

## 2019-08-28 NOTE — Patient Instructions (Signed)
Sclerotherapy  Sclerotherapy is a procedure that is done to improve the appearance of varicose veins and spider veins and to help relieve aching, swelling, cramping, and pain in the legs. Varicose veins are veins that have become enlarged, bulging, and twisted due to a damaged valve that causes blood to collect (pool) in the veins. Spider veins are small varicose veins. Sclerotherapy usually works best for smaller spider and varicose veins. This procedure involves injecting a chemical into the vein to close it off. You may need more than one treatment to close a vein all the way. Sclerotherapy is usually performed on the legs because that is where varicose and spider veins most often occur. Tell a health care provider about:  Any allergies you have.  All medicines you are taking, including vitamins, herbs, eye drops, creams, and over-the-counter medicines.  Any blood disorders you have.  Any surgeries you have had.  Any medical conditions you have.  Whether you are pregnant or may be pregnant. What are the risks? Generally, this is a safe procedure. However, problems may occur, including:  Infection.  Bleeding.  Allergic reactions to medicines or dyes.  Blood clots.  Nerve damage.  Bruising and scarring.  Darkened skin around the area. What happens before the procedure?  Do not use lotions or creams on your legs unless your health care provider approves.  Follow instructions from your health care provider about eating and drinking restrictions.  Do not use any products that contain nicotine or tobacco, such as cigarettes and e-cigarettes. If you need help quitting, ask your health care provider.  Ask your health care provider about: ? Changing or stopping your regular medicines. This is especially important if you are taking diabetes medicines or blood thinners. ? Taking medicines such as aspirin and ibuprofen. These medicines can thin your blood. Do not take these  medicines before your procedure if your health care provider instructs you not to.  You may have an ultrasound of the affected area to check for blood clots and to check blood flow.  In rare cases, you may have an X-ray procedure to check how blood flows through your veins (angiogram). For an angiogram, a dye is injected to outline your veins on X-rays. What happens during the procedure?  To lower your risk of infection: ? Your health care team will wash or sanitize their hands. ? Your skin will be washed with soap. ? Hair may be removed from the treatment area.  A small, thin needle will be used to inject a chemical (sclerosant) into your varicose vein. The sclerosant will irritate the lining of the vein and cause the vein to close below the injection site. You may feel some stinging, burning, or irritation.  The injection may be repeated for more than one varicose vein.  The injection area will be wrapped with elastic bandages. The procedure may vary among health care providers and hospitals. What happens after the procedure?  Your injection area will be wrapped with elastic bandages. If there is bleeding, the bandages may be changed.  Do not drive until your health care provider approves. You may need to wait 1-2 days before driving.  You will need to wear compression stockings for about a week, or as long as your health care provider recommends. Summary  Sclerotherapy is a procedure that is done to improve the appearance of varicose veins and spider veins and to help relieve aching, swelling, cramping, and pain in the legs.  A small, thin needle is   used to inject a chemical (sclerosant) into a spider vein or varicose vein to close it off.  Elastic bandages will be wrapped around the injection area after the procedure. This information is not intended to replace advice given to you by your health care provider. Make sure you discuss any questions you have with your health care  provider. Document Revised: 08/08/2018 Document Reviewed: 06/05/2016 Elsevier Patient Education  2020 Elsevier Inc.  

## 2019-08-28 NOTE — Progress Notes (Signed)
MRN : [id number]  Bryan Cortez is a 49 y.o. (1970-08-02) male who presents with chief complaint of  Chief Complaint  Patient presents with  . Follow-up    4 week  no studies  .  History of Present Illness: Patient returns today in follow up of his venous insufficiency.  He is about a month status post left great saphenous vein laser ablation for symptomatic reflux.  He had a successful ablation without DVT or complications.  He is doing well today.  He notices significant improvement in the pain and swelling in the left leg.  The varicosities are less prominent, although he still does have some relatively large tender varicosities in the left lower leg.  These do still cause him itching and burning particularly when he is on his feet for long periods of time.  He has some varicosities on the right leg but they are not currently bothering him.  Current Outpatient Medications  Medication Sig Dispense Refill  . ANUCORT-HC 25 MG suppository Place 25 mg rectally 2 (two) times daily.    . hydrochlorothiazide (HYDRODIURIL) 25 MG tablet Take 1 tablet (25 mg total) by mouth daily. 90 tablet 2  . lisinopril (ZESTRIL) 20 MG tablet Take 1 tablet (20 mg total) by mouth daily. 30 tablet 3  . ALPRAZolam (XANAX) 0.5 MG tablet TAKE 1 TABLET BY MOUTH ONE HOUR PRIOR TO PROCEDURE TAKE 2ND TABLET UPON ARRIVAL    . traMADol (ULTRAM) 50 MG tablet Take 1 tablet (50 mg total) by mouth every 6 (six) hours as needed. (Patient not taking: Reported on 05/21/2019) 10 tablet 0   No current facility-administered medications for this visit.    Past Medical History:  Diagnosis Date  . Arthritis    right ankle  . Dental bridge present    Top front  . Hypertension   . Kidney stone   . Motion sickness    boats    Past Surgical History:  Procedure Laterality Date  . COLONOSCOPY WITH PROPOFOL N/A 01/23/2019   Procedure: COLONOSCOPY WITH BIOPSIES;  Surgeon: Lucilla Lame, MD;  Location: [place];  Service:  Endoscopy;  Laterality: N/A;  . POLYPECTOMY N/A 01/23/2019   Procedure: POLYPECTOMY;  Surgeon: Lucilla Lame, MD;  Location: [place];  Service: Endoscopy;  Laterality: N/A;  . TESTICLE SURGERY Bilateral 1974   undistended     Social History   Tobacco Use  . Smoking status: Never Smoker  . Smokeless tobacco: Never Used  Substance Use Topics  . Alcohol use: No    Comment: rarely  . Drug use: No      Family History  Problem Relation Age of Onset  . Bladder Cancer Father   . Heart disease Father   . Cancer Father        bladder  . Dementia Father   . Kidney Stones Sister   . Obesity Sister   . Other Sister        prediabetes  . Cancer Mother        unknown CA  . Anxiety disorder Brother   . Kidney cancer Neg Hx   . Prostate cancer Neg Hx      Allergies  Allergen Reactions  . Naproxen Hives    REVIEW OF SYSTEMS (Negative unless checked) Review of Systems: Negative Unless Checked Constitutional: [] ??Weight loss[] ??Fever[] ??Chills Cardiac:[] ??Chest pain[] ??Atrial Fibrillation[] ??Palpitations [] ??Shortness of breath when laying flat [] ??Shortness of breath with exertion. [] ??Shortness of breath at rest Vascular: [] ??Pain in legs with walking[] ??Pain in  legswith standing[] ??Pain in legs when laying flat [] ??Claudication [] ??Pain in feet when laying flat [] ??History of DVT [] ??Phlebitis [x] ??Swelling in legs [x] ??Varicose veins [] ??Non-healing ulcers Pulmonary: [] ??Uses home oxygen [] ??Productive cough[] ??Hemoptysis [] ??Wheeze [] ??COPD [] ??Asthma Neurologic: [] ??Dizziness[] ??Seizures [] ??Blackouts[] ??History of stroke [] ??History of TIA[] ??Aphasia [] ??Temporary Blindness[] ??Weaknessor numbness in arm [] ??Weakness or numbnessin leg Musculoskeletal:[] ??Joint swelling [] ??Joint pain [] ??Low back pain [] ??History of Knee Replacement [] ??Arthritis [] ??back Surgeries[] ??Spinal Stenosis   Hematologic:[] ??Easy bruising[] ??Easy bleeding [] ??Hypercoagulable state [] ??Anemic Gastrointestinal:[] ??Diarrhea [] ??Vomiting[] ??Gastroesophageal reflux/heartburn[] ??Difficulty swallowing. [] ??Abdominal pain Genitourinary: [] ??Chronic kidney disease [] ??Difficulturination [] ??Anuric[] ??Blood in urine [] ??Frequenturination [] ??Burning with urination[] ??Hematuria Skin: [] ??Rashes [] ??Ulcers [] ??Wounds Psychological: [] ??History of anxiety[] ??History of major depression [] ??Memory Difficulties   Physical Examination  BP 127/76   Pulse 65   Ht 6' (1.829 m)   Wt 220 lb (99.8 kg)   BMI 29.84 kg/m  Gen:  WD/WN, NAD Head: [place]/AT, No temporalis wasting. Ear/Nose/Throat: Hearing grossly intact, nares w/o erythema or drainage Eyes: Conjunctiva clear. Sclera non-icteric Neck: Supple.  Trachea midline Pulmonary:  Good air movement, no use of accessory muscles.  Cardiac: RRR, no JVD Vascular:  Vessel Right Left  Radial Palpable Palpable                          PT Palpable Palpable  DP Palpable Palpable   Gastrointestinal: soft, non-tender/non-distended. No guarding/reflex.  Musculoskeletal: M/S 5/5 throughout.  No deformity or atrophy.  Residual varicosities in the left leg most prominent in the posterior calf and medial ankle area.  These are significantly improved from previous, but still measuring 2 to 3 mm.  No lower extremity edema. Neurologic: Sensation grossly intact in extremities.  Symmetrical.  Speech is fluent.  Psychiatric: Judgment intact, Mood & affect appropriate for pt's clinical situation. Dermatologic: No rashes or ulcers noted.  No cellulitis or open wounds.       Labs No results found for this or any previous visit (from the past 2160 hour(s)).  Radiology No results found.  Assessment/Plan Hypertension blood pressure control important in reducing the progression of atherosclerotic disease. On appropriate oral  medications.  Varicose veins of left lower extremity with pain Recommend:  The patient has had successful ablation of the previously incompetent left great saphenous venous system but still has persistent symptoms of pain and swelling that are having a negative impact on daily life and daily activities.  Patient should undergo injection sclerotherapy in his case probably predominantly foam sclerotherapy to treat the residual varicosities.  The risks, benefits and alternative therapies were reviewed in detail with the patient.  All questions were answered.  The patient agrees to proceed with sclerotherapy at their convenience.  The patient will continue wearing the graduated compression stockings and using the over-the-counter pain medications to treat her symptoms.         Leotis Pain, MD  08/28/2019 10:03 AM    This note was created with Dragon medical transcription system.  Any errors from dictation are purely unintentional

## 2019-08-28 NOTE — Assessment & Plan Note (Signed)
Recommend:  The patient has had successful ablation of the previously incompetent left great saphenous venous system but still has persistent symptoms of pain and swelling that are having a negative impact on daily life and daily activities.  Patient should undergo injection sclerotherapy in his case probably predominantly foam sclerotherapy to treat the residual varicosities.  The risks, benefits and alternative therapies were reviewed in detail with the patient.  All questions were answered.  The patient agrees to proceed with sclerotherapy at their convenience.  The patient will continue wearing the graduated compression stockings and using the over-the-counter pain medications to treat her symptoms.

## 2019-09-02 DIAGNOSIS — K6289 Other specified diseases of anus and rectum: Secondary | ICD-10-CM | POA: Diagnosis not present

## 2019-09-02 DIAGNOSIS — M7989 Other specified soft tissue disorders: Secondary | ICD-10-CM | POA: Diagnosis not present

## 2019-10-05 DIAGNOSIS — Z4889 Encounter for other specified surgical aftercare: Secondary | ICD-10-CM | POA: Diagnosis not present

## 2019-11-14 DIAGNOSIS — J029 Acute pharyngitis, unspecified: Secondary | ICD-10-CM | POA: Diagnosis not present

## 2019-11-20 ENCOUNTER — Other Ambulatory Visit: Payer: Self-pay | Admitting: Family Medicine

## 2019-11-20 NOTE — Telephone Encounter (Signed)
Requested Prescriptions  Pending Prescriptions Disp Refills  . lisinopril (ZESTRIL) 20 MG tablet [Pharmacy Med Name: LISINOPRIL 20MG  TABLETS] 90 tablet 0    Sig: TAKE 1 TABLET(20 MG) BY MOUTH DAILY     Cardiovascular:  ACE Inhibitors Failed - 11/20/2019 11:38 AM      Failed - Cr in normal range and within 180 days    Creatinine, Ser  Date Value Ref Range Status  04/01/2019 0.90 0.61 - 1.24 mg/dL Final         Failed - K in normal range and within 180 days    Potassium  Date Value Ref Range Status  04/01/2019 3.7 3.5 - 5.1 mmol/L Final         Passed - Patient is not pregnant      Passed - Last BP in normal range    BP Readings from Last 1 Encounters:  08/28/19 127/76         Passed - Valid encounter within last 6 months    Recent Outpatient Visits          4 months ago Anal fissure   [facility], Parro[place] E, [place]   9 months ago Essential hypertension   [facility], Dionne Bucy, [place]   10 months ago Encounter for annual physical exam   [facility], Dionne Bucy, [place]   1 year ago Essential hypertension   [facility], Dionne Bucy, [place]   1 year ago Essential hypertension   [facility], Dionne Bucy, [place]      Future Appointments            In 4 days [place], Kelby Aline, [place], [facility]            Phone call to pt.  Advised of need to sched. 6 mo. F/u appt.  Appt. Scheduled 11/24/19 @ 10:20 AM with Nurse Practitioner.  Refill done per protocol.

## 2019-11-23 NOTE — Progress Notes (Signed)
Established patient visit   Patient: Bryan Cortez   DOB: [birth year]   49 y.o. Male  MRN: [id number] Visit Date: 11/24/2019  Today's healthcare provider: Marcille Buffy, FNP   Chief Complaint  Patient presents with  . Hypertension   Subjective    HPI  Hypertension, follow-up   BP Readings from Last 3 Encounters:  11/24/19 112/80  08/28/19 127/76  07/17/19 123/82   Wt Readings from Last 3 Encounters:  11/24/19 (!) 226 lb 3.2 oz (102.6 kg)  08/28/19 220 lb (99.8 kg)  07/17/19 224 lb (101.6 kg)     He was last seen for hypertension 9 months ago.  BP at that visit was 131/84. Management since that visit includes none.  He reports excellent compliance with treatment.  He is not having side effects.  He is following a poor diet. He is not exercising. He does not smoke.   He had Covid in April. He has noticed that he occasionally has lightheadedness with standing up quickly.  Denies any syncope. Denies any low readings at home. He is working outside in the heat and reports not drinking enough water lately.   Use of agents associated with hypertension: none.   Outside blood pressures are systolic 962-952 and diastolic in the 84X. Symptoms: No chest pain No chest pressure  No palpitations No syncope  No dyspnea No orthopnea  No paroxysmal nocturnal dyspnea No lower extremity edema   2nd covid vaccine was last Friday. He is completed.   Patient  denies any fever, body aches,chills, rash, chest pain, shortness of breath, nausea, vomiting, or diarrhea.   Pertinent labs: Lab Results  Component Value Date   CHOL 190 01/13/2019   HDL 50 01/13/2019   LDLCALC 131 (H) 01/13/2019   TRIG 45 01/13/2019   CHOLHDL 3.8 01/13/2019   Lab Results  Component Value Date   NA 141 04/01/2019   K 3.7 04/01/2019   CREATININE 0.90 04/01/2019   GFRNONAA 81 02/12/2019   GFRAA 94 02/12/2019   GLUCOSE 97 04/01/2019     The 10-year ASCVD risk score Mikey Bussing DC Jr., et al.,  2013) is: 6.3%   ---------------------------------------------------------------------------------------------------   Patient Active Problem List   Diagnosis Date Noted  . Special screening for malignant neoplasms, colon   . Polyp of transverse colon   . Varicose veins of left lower extremity with pain 01/13/2019  . Chronic idiopathic anal pain 09/10/2018  . Anal fissure 01/02/2018  . Hypertension 12/12/2016  . Obesity 12/12/2016  . Allergic rhinitis 11/24/2015  . Frequent headaches 11/24/2015   Past Medical History:  Diagnosis Date  . Arthritis    right ankle  . Dental bridge present    Top front  . Hypertension   . Kidney stone   . Motion sickness    boats   Allergies  Allergen Reactions  . Naproxen Hives       Medications: Outpatient Medications Prior to Visit  Medication Sig  . ALPRAZolam (XANAX) 0.5 MG tablet TAKE 1 TABLET BY MOUTH ONE HOUR PRIOR TO PROCEDURE TAKE 2ND TABLET UPON ARRIVAL  . hydrochlorothiazide (HYDRODIURIL) 25 MG tablet Take 1 tablet (25 mg total) by mouth daily.  Marland Kitchen lisinopril (ZESTRIL) 20 MG tablet TAKE 1 TABLET(20 MG) BY MOUTH DAILY  . ANUCORT-HC 25 MG suppository Place 25 mg rectally 2 (two) times daily. (Patient not taking: Reported on 11/24/2019)  . traMADol (ULTRAM) 50 MG tablet Take 1 tablet (50 mg total) by mouth every 6 (six) hours  as needed. (Patient not taking: Reported on 05/21/2019)   No facility-administered medications prior to visit.    Review of Systems  Last CBC Lab Results  Component Value Date   WBC 4.8 11/21/2015   HGB 15.3 04/01/2019   HCT 45.0 04/01/2019   MCV 89.4 11/21/2015   MCH 31.3 11/21/2015   RDW 12.5 11/21/2015   PLT 160 47/65/4650   Last metabolic panel Lab Results  Component Value Date   GLUCOSE 97 04/01/2019   NA 141 04/01/2019   K 3.7 04/01/2019   CL 103 04/01/2019   CO2 22 02/12/2019   BUN 21 (H) 04/01/2019   CREATININE 0.90 04/01/2019   GFRNONAA 81 02/12/2019   GFRAA 94 02/12/2019    CALCIUM 9.1 02/12/2019   PROT 7.0 01/13/2019   ALBUMIN 4.6 01/13/2019   LABGLOB 2.4 01/13/2019   AGRATIO 1.9 01/13/2019   BILITOT 0.9 01/13/2019   ALKPHOS 63 01/13/2019   AST 22 01/13/2019   ALT 26 01/13/2019   ANIONGAP 7 11/21/2015      Objective    BP 112/80   Pulse 64   Temp (!) 96.9 F (36.1 C) (Oral)   Resp 16   Wt (!) 226 lb 3.2 oz (102.6 kg)   SpO2 98%   BMI 30.68 kg/m  BP Readings from Last 3 Encounters:  11/24/19 112/80  08/28/19 127/76  07/17/19 123/82      Physical Exam Vitals reviewed.  Constitutional:      General: He is not in acute distress.    Appearance: Normal appearance. He is not ill-appearing, toxic-appearing or diaphoretic.  HENT:     Head: Normocephalic and atraumatic.     Right Ear: External ear normal.     Left Ear: External ear normal.  Eyes:     Pupils: Pupils are equal, round, and reactive to light.  Neck:     Vascular: No carotid bruit.  Cardiovascular:     Rate and Rhythm: Normal rate and regular rhythm.     Pulses: Normal pulses.     Heart sounds: Normal heart sounds. No murmur heard.  No friction rub. No gallop.   Pulmonary:     Effort: Pulmonary effort is normal. No respiratory distress.     Breath sounds: Normal breath sounds. No stridor. No wheezing, rhonchi or rales.  Chest:     Chest wall: No tenderness.  Abdominal:     General: There is no distension.     Palpations: Abdomen is soft.     Tenderness: There is no abdominal tenderness.  Musculoskeletal:        General: Normal range of motion.     Cervical back: Normal range of motion and neck supple.  Skin:    General: Skin is warm.     Capillary Refill: Capillary refill takes less than 2 seconds.     Findings: No rash.  Neurological:     General: No focal deficit present.     Mental Status: He is oriented to person, place, and time.  Psychiatric:        Mood and Affect: Mood normal.        Behavior: Behavior normal.        Thought Content: Thought content  normal.        Judgment: Judgment normal.      No results found for any visits on 11/24/19.  Assessment & Plan     Essential hypertension - Plan: Comprehensive metabolic panel, Lipid panel, CBC with Differential/Platelet  Hyperlipidemia associated with  type 2 diabetes mellitus ([place]) - Plan: Lipid panel  Lightheadedness suspect is coming from dehydration however his blood pressure today in the office in 270/78 systolic is lower than usual.  He denies any low readings at home.  He will continue to monitor and check his blood pressure when he becomes lightheaded.  Advised that he should hydrate given that he is working outside in this heat and return to the clinic should any symptoms change or worsen at any time. Provider does order labs for follow-up and to rule out any electrolyte abnormalities.  Patient is aware of lab order. Orders Placed This Encounter  Procedures  . Comprehensive metabolic panel  . Lipid panel  . CBC with Differential/Platelet   He denies any need for refills of medication today   Return if symptoms worsen or fail to improve CPE in september due, for at any time for any worsening symptoms, Go to Emergency room/ urgent care if worse.      IWellington Hampshire Kealani Leckey, FNP, have reviewed all documentation for this visit. The documentation on 11/24/19 for the exam, diagnosis, procedures, and orders are all accurate and complete.    Marcille Buffy, [place] [phone] (phone) [phone] (fax)  [place]

## 2019-11-24 ENCOUNTER — Ambulatory Visit: Payer: Federal, State, Local not specified - PPO | Admitting: Adult Health

## 2019-11-24 ENCOUNTER — Other Ambulatory Visit: Payer: Self-pay

## 2019-11-24 ENCOUNTER — Encounter: Payer: Self-pay | Admitting: Adult Health

## 2019-11-24 VITALS — BP 112/80 | HR 64 | Temp 96.9°F | Resp 16 | Wt 226.2 lb

## 2019-11-24 DIAGNOSIS — E1169 Type 2 diabetes mellitus with other specified complication: Secondary | ICD-10-CM | POA: Diagnosis not present

## 2019-11-24 DIAGNOSIS — E785 Hyperlipidemia, unspecified: Secondary | ICD-10-CM | POA: Diagnosis not present

## 2019-11-24 DIAGNOSIS — I10 Essential (primary) hypertension: Secondary | ICD-10-CM | POA: Diagnosis not present

## 2019-11-24 HISTORY — DX: Hyperlipidemia, unspecified: E78.5

## 2019-11-24 NOTE — Patient Instructions (Signed)

## 2019-12-08 ENCOUNTER — Other Ambulatory Visit: Payer: Self-pay | Admitting: Family Medicine

## 2019-12-08 MED ORDER — HYDROCHLOROTHIAZIDE 25 MG PO TABS: 25.0000 mg | ORAL_TABLET | Freq: Every day | ORAL | 2 refills | Status: DC

## 2019-12-08 NOTE — Telephone Encounter (Signed)
Medication Refill - Medication: HCTZ  Has the patient contacted their pharmacy? Yes.   (Agent: If no, request that the patient contact the pharmacy for the refill.) (Agent: If yes, when and what did the pharmacy advise?)  Preferred Pharmacy (with phone number or street name): [facility] [place], [place]: Please be advised that RX refills may take up to 3 business days. We ask that you follow-up with your pharmacy.

## 2019-12-28 DIAGNOSIS — Z4889 Encounter for other specified surgical aftercare: Secondary | ICD-10-CM | POA: Diagnosis not present

## 2020-01-12 NOTE — Progress Notes (Signed)
Complete physical exam   Patient: Bryan Cortez   DOB: 1970-11-24   49 y.o. Male  MRN: [id number] Visit Date: 01/13/2020  Today's healthcare provider: Trinna Post, PA-C   Chief Complaint  Patient presents with  . Annual Exam  I,Alyah Boehning M Rhianon Zabawa,acting as a scribe for Trinna Post, PA-C.,have documented all relevant documentation on the behalf of Trinna Post, PA-C,as directed by  Trinna Post, PA-C while in the presence of Trinna Post, PA-C.  Subjective    Bryan Cortez is a 49 y.o. male who presents today for a complete physical exam.  He reports consuming a general and low sodium diet. Home exercise routine includes walking 5 hrs per days. He generally feels well. He reports sleeping well. He does not have additional problems to discuss today.  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 Has his COVID vaccine and had COVID in 08/2019.  Past Medical History:  Diagnosis Date  . Arthritis    right ankle  . Dental bridge present    Top front  . Hypertension   . Kidney stone   . Motion sickness    boats   Past Surgical History:  Procedure Laterality Date  . COLONOSCOPY WITH PROPOFOL N/A 01/23/2019   Procedure: COLONOSCOPY WITH BIOPSIES;  Surgeon: Lucilla Lame, MD;  Location: [facility];  Service: Endoscopy;  Laterality: N/A;  . POLYPECTOMY N/A 01/23/2019   Procedure: POLYPECTOMY;  Surgeon: Lucilla Lame, MD;  Location: [facility];  Service: Endoscopy;  Laterality: N/A;  . TESTICLE SURGERY Bilateral 1974   undistended   Social History   Socioeconomic History  . Marital status: Married    Spouse name: Antione Obar  . Number of children: 2  . Years of education: some college  . Highest education level: Not on file  Occupational History    Employer: [name]  Tobacco Use  . Smoking status: Never Smoker  . Smokeless tobacco: Never Used  Vaping Use  . Vaping Use: Never used  Substance and Sexual Activity  .  Alcohol use: No    Comment: rarely  . Drug use: No  . Sexual activity: Yes  Other Topics Concern  . Not on file  Social History Narrative  . Not on file   Social Determinants of Health   Financial Resource Strain:   . Difficulty of Paying Living Expenses: Not on file  Food Insecurity:   . Worried About Charity fundraiser in the Last Year: Not on file  . Ran Out of Food in the Last Year: Not on file  Transportation Needs:   . Lack of Transportation (Medical): Not on file  . Lack of Transportation (Non-Medical): Not on file  Physical Activity:   . Days of Exercise per Week: Not on file  . Minutes of Exercise per Session: Not on file  Stress:   . Feeling of Stress : Not on file  Social Connections:   . Frequency of Communication with Friends and Family: Not on file  . Frequency of Social Gatherings with Friends and Family: Not on file  . Attends Religious Services: Not on file  . Active Member of Clubs or Organizations: Not on file  . Attends Archivist Meetings: Not on file  . Marital Status: Not on file  Intimate Partner Violence:   . Fear of Current or Ex-Partner: Not on file  . Emotionally Abused: Not on file  .  Physically Abused: Not on file  . Sexually Abused: Not on file   Family Status  Relation Name Status  . Father  Deceased  . Sister  Alive  . Mother  Deceased  . Brother  Alive  . Neg Hx  (Not Specified)   Family History  Problem Relation Age of Onset  . Bladder Cancer Father   . Heart disease Father   . Cancer Father        bladder  . Dementia Father   . Kidney Stones Sister   . Obesity Sister   . Other Sister        prediabetes  . Cancer Mother        unknown CA  . Anxiety disorder Brother   . Kidney cancer Neg Hx   . Prostate cancer Neg Hx    Allergies  Allergen Reactions  . Naproxen Hives    Patient Care Team: Virginia Crews, MD as PCP - General (Family Medicine)   Medications: Outpatient Medications Prior to Visit    Medication Sig  . hydrochlorothiazide (HYDRODIURIL) 25 MG tablet Take 1 tablet (25 mg total) by mouth daily.  Marland Kitchen lisinopril (ZESTRIL) 20 MG tablet TAKE 1 TABLET(20 MG) BY MOUTH DAILY  . ALPRAZolam (XANAX) 0.5 MG tablet TAKE 1 TABLET BY MOUTH ONE HOUR PRIOR TO PROCEDURE TAKE 2ND TABLET UPON ARRIVAL (Patient not taking: Reported on 01/13/2020)  . ANUCORT-HC 25 MG suppository Place 25 mg rectally 2 (two) times daily. (Patient not taking: Reported on 11/24/2019)  . traMADol (ULTRAM) 50 MG tablet Take 1 tablet (50 mg total) by mouth every 6 (six) hours as needed. (Patient not taking: Reported on 05/21/2019)   No facility-administered medications prior to visit.    Review of Systems  Constitutional: Negative.   HENT: Negative.   Eyes: Negative.   Respiratory: Negative.   Cardiovascular: Negative.   Gastrointestinal: Negative.   Endocrine: Negative.   Genitourinary: Negative.   Musculoskeletal: Negative.   Skin: Negative.   Allergic/Immunologic: Positive for environmental allergies.  Neurological: Positive for light-headedness.  Hematological: Negative.   Psychiatric/Behavioral: Negative.       Objective    BP 113/82 (BP Location: Left Arm, Patient Position: Sitting, Cuff Size: Large)   Pulse 73   Temp 98.7 F (37.1 C) (Oral)   Ht 6' (1.829 m)   Wt 223 lb 3.2 oz (101.2 kg)   SpO2 97%   BMI 30.27 kg/m    Physical Exam Constitutional:      Appearance: Normal appearance.  HENT:     Right Ear: Tympanic membrane, ear canal and external ear normal.     Left Ear: Tympanic membrane, ear canal and external ear normal.  Cardiovascular:     Rate and Rhythm: Normal rate and regular rhythm.     Pulses: Normal pulses.     Heart sounds: Normal heart sounds.  Pulmonary:     Effort: Pulmonary effort is normal.     Breath sounds: Normal breath sounds.  Abdominal:     General: Abdomen is flat. Bowel sounds are normal.     Palpations: Abdomen is soft.  Skin:    General: Skin is warm and  dry.  Neurological:     General: No focal deficit present.     Mental Status: He is alert and oriented to person, place, and time.  Psychiatric:        Mood and Affect: Mood normal.        Behavior: Behavior normal.  Last depression screening scores PHQ 2/9 Scores 01/13/2020 01/13/2019 01/02/2018  PHQ - 2 Score 0 0 1  PHQ- 9 Score 0 0 2   Last fall risk screening Fall Risk  01/13/2020  Falls in the past year? 0  Number falls in past yr: 0  Injury with Fall? 0  Risk for fall due to : No Fall Risks  Follow up Falls evaluation completed   Last Audit-C alcohol use screening Alcohol Use Disorder Test (AUDIT) 01/13/2020  1. How often do you have a drink containing alcohol? 1  2. How many drinks containing alcohol do you have on a typical day when you are drinking? 0  3. How often do you have six or more drinks on one occasion? 0  AUDIT-C Score 1   A score of 3 or more in women, and 4 or more in men indicates increased risk for alcohol abuse, EXCEPT if all of the points are from question 1   No results found for any visits on 01/13/20.  Assessment & Plan    Routine Health Maintenance and Physical Exam  Exercise Activities and Dietary recommendations Goals   None     Immunization History  Administered Date(s) Administered  . Influenza,inj,Quad PF,6+ Mos 01/02/2018, 01/13/2019  . Tdap 12/03/2013    Health Maintenance  Topic Date Due  . Hepatitis C Screening  Never done  . COVID-19 Vaccine (1) Never done  . INFLUENZA VACCINE  11/29/2019  . TETANUS/TDAP  12/04/2023  . COLONOSCOPY  01/23/2024  . HIV Screening  Completed    Discussed health benefits of physical activity, and encouraged him to engage in regular exercise appropriate for his age and condition.  1. Annual physical exam  - HIV Antibody (routine testing w rflx) - TSH - Lipid panel - Comprehensive metabolic panel - CBC with Differential/Platelet - Hepatitis C Antibody  2. Need for influenza  vaccination  - Flu Vaccine QUAD 36+ mos IM   No follow-ups on file.     ITrinna Post, PA-C, have reviewed all documentation for this visit. The documentation on 01/14/20 for the exam, diagnosis, procedures, and orders are all accurate and complete.  The entirety of the information documented in the History of Present Illness, Review of Systems and Physical Exam were personally obtained by me. Portions of this information were initially documented by [facility] and reviewed by me for thoroughness and accuracy.     Paulene Floor  [facility] [phone] (phone) [phone] (fax)  [place]

## 2020-01-13 ENCOUNTER — Encounter: Payer: Self-pay | Admitting: Physician Assistant

## 2020-01-13 ENCOUNTER — Other Ambulatory Visit: Payer: Self-pay

## 2020-01-13 ENCOUNTER — Ambulatory Visit (INDEPENDENT_AMBULATORY_CARE_PROVIDER_SITE_OTHER): Payer: Federal, State, Local not specified - PPO | Admitting: Physician Assistant

## 2020-01-13 VITALS — BP 113/82 | HR 73 | Temp 98.7°F | Ht 72.0 in | Wt 223.2 lb

## 2020-01-13 DIAGNOSIS — Z23 Encounter for immunization: Secondary | ICD-10-CM

## 2020-01-13 DIAGNOSIS — Z0000 Encounter for general adult medical examination without abnormal findings: Secondary | ICD-10-CM | POA: Diagnosis not present

## 2020-01-13 NOTE — Patient Instructions (Addendum)
Shingrix - call your insurance and see where it's paid for    Preventive Care 47-51 Years Old, Male Preventive care refers to lifestyle choices and visits with your health care provider that can promote health and wellness. This includes:  A yearly physical exam. This is also called an annual well check.  Regular dental and eye exams.  Immunizations.  Screening for certain conditions.  Healthy lifestyle choices, such as eating a healthy diet, getting regular exercise, not using drugs or products that contain nicotine and tobacco, and limiting alcohol use. What can I expect for my preventive care visit? Physical exam Your health care provider will check:  Height and weight. These may be used to calculate body mass index (BMI), which is a measurement that tells if you are at a healthy weight.  Heart rate and blood pressure.  Your skin for abnormal spots. Counseling Your health care provider may ask you questions about:  Alcohol, tobacco, and drug use.  Emotional well-being.  Home and relationship well-being.  Sexual activity.  Eating habits.  Work and work Statistician. What immunizations do I need?  Influenza (flu) vaccine  This is recommended every year. Tetanus, diphtheria, and pertussis (Tdap) vaccine  You may need a Td booster every 10 years. Varicella (chickenpox) vaccine  You may need this vaccine if you have not already been vaccinated. Zoster (shingles) vaccine  You may need this after age 78. Measles, mumps, and rubella (MMR) vaccine  You may need at least one dose of MMR if you were born in 1957 or later. You may also need a second dose. Pneumococcal conjugate (PCV13) vaccine  You may need this if you have certain conditions and were not previously vaccinated. Pneumococcal polysaccharide (PPSV23) vaccine  You may need one or two doses if you smoke cigarettes or if you have certain conditions. Meningococcal conjugate (MenACWY) vaccine  You may  need this if you have certain conditions. Hepatitis A vaccine  You may need this if you have certain conditions or if you travel or work in places where you may be exposed to hepatitis A. Hepatitis B vaccine  You may need this if you have certain conditions or if you travel or work in places where you may be exposed to hepatitis B. Haemophilus influenzae type b (Hib) vaccine  You may need this if you have certain risk factors. Human papillomavirus (HPV) vaccine  If recommended by your health care provider, you may need three doses over 6 months. You may receive vaccines as individual doses or as more than one vaccine together in one shot (combination vaccines). Talk with your health care provider about the risks and benefits of combination vaccines. What tests do I need? Blood tests  Lipid and cholesterol levels. These may be checked every 5 years, or more frequently if you are over 53 years old.  Hepatitis C test.  Hepatitis B test. Screening  Lung cancer screening. You may have this screening every year starting at age 88 if you have a 30-pack-year history of smoking and currently smoke or have quit within the past 15 years.  Prostate cancer screening. Recommendations will vary depending on your family history and other risks.  Colorectal cancer screening. All adults should have this screening starting at age 80 and continuing until age 51. Your health care provider may recommend screening at age 33 if you are at increased risk. You will have tests every 1-10 years, depending on your results and the type of screening test.  Diabetes  screening. This is done by checking your blood sugar (glucose) after you have not eaten for a while (fasting). You may have this done every 1-3 years.  Sexually transmitted disease (STD) testing. Follow these instructions at home: Eating and drinking  Eat a diet that includes fresh fruits and vegetables, whole grains, lean protein, and low-fat dairy  products.  Take vitamin and mineral supplements as recommended by your health care provider.  Do not drink alcohol if your health care provider tells you not to drink.  If you drink alcohol: ? Limit how much you have to 0-2 drinks a day. ? Be aware of how much alcohol is in your drink. In the [place] one drink equals one 12 oz bottle of beer (355 mL), one 5 oz glass of wine (148 mL), or one 1 oz glass of hard liquor (44 mL). Lifestyle  Take daily care of your teeth and gums.  Stay active. Exercise for at least 30 minutes on 5 or more days each week.  Do not use any products that contain nicotine or tobacco, such as cigarettes, e-cigarettes, and chewing tobacco. If you need help quitting, ask your health care provider.  If you are sexually active, practice safe sex. Use a condom or other form of protection to prevent STIs (sexually transmitted infections).  Talk with your health care provider about taking a low-dose aspirin every day starting at age 40. What's next?  Go to your health care provider once a year for a well check visit.  Ask your health care provider how often you should have your eyes and teeth checked.  Stay up to date on all vaccines. This information is not intended to replace advice given to you by your health care provider. Make sure you discuss any questions you have with your health care provider. Document Revised: 04/10/2018 Document Reviewed: 04/10/2018 Elsevier Patient Education  2020 [employer].

## 2020-01-14 ENCOUNTER — Encounter: Payer: Self-pay | Admitting: Adult Health

## 2020-01-14 DIAGNOSIS — Z0000 Encounter for general adult medical examination without abnormal findings: Secondary | ICD-10-CM | POA: Diagnosis not present

## 2020-01-15 LAB — COMPREHENSIVE METABOLIC PANEL
ALT: 24 IU/L (ref 0–44)
AST: 20 IU/L (ref 0–40)
Albumin/Globulin Ratio: 1.8 (ref 1.2–2.2)
Albumin: 4.4 g/dL (ref 3.8–4.9)
Alkaline Phosphatase: 61 IU/L (ref 44–121)
BUN/Creatinine Ratio: 21 — ABNORMAL HIGH (ref 9–20)
BUN: 21 mg/dL (ref 6–24)
Bilirubin Total: 1 mg/dL (ref 0.0–1.2)
CO2: 25 mmol/L (ref 20–29)
Calcium: 9.2 mg/dL (ref 8.7–10.2)
Chloride: 100 mmol/L (ref 96–106)
Creatinine, Ser: 0.99 mg/dL (ref 0.76–1.27)
GFR calc Af Amer: 102 mL/min/{1.73_m2} (ref >59)
GFR calc non Af Amer: 88 mL/min/{1.73_m2} (ref >59)
Globulin, Total: 2.5 g/dL (ref 1.5–4.5)
Glucose: 89 mg/dL (ref 65–99)
Potassium: 3.8 mmol/L (ref 3.5–5.2)
Sodium: 137 mmol/L (ref 134–144)
Total Protein: 6.9 g/dL (ref 6.0–8.5)

## 2020-01-15 LAB — CBC WITH DIFFERENTIAL/PLATELET
Basophils Absolute: 0 10*3/uL (ref 0.0–0.2)
Basos: 0 %
EOS (ABSOLUTE): 0.1 10*3/uL (ref 0.0–0.4)
Eos: 3 %
Hematocrit: 43.2 % (ref 37.5–51.0)
Hemoglobin: 15.1 g/dL (ref 13.0–17.7)
Immature Grans (Abs): 0 10*3/uL (ref 0.0–0.1)
Immature Granulocytes: 0 %
Lymphocytes Absolute: 1.9 10*3/uL (ref 0.7–3.1)
Lymphs: 40 %
MCH: 32 pg (ref 26.6–33.0)
MCHC: 35 g/dL (ref 31.5–35.7)
MCV: 92 fL (ref 79–97)
Monocytes Absolute: 0.6 10*3/uL (ref 0.1–0.9)
Monocytes: 13 %
Neutrophils Absolute: 2.1 10*3/uL (ref 1.4–7.0)
Neutrophils: 44 %
Platelets: 188 10*3/uL (ref 150–450)
RBC: 4.72 x10E6/uL (ref 4.14–5.80)
RDW: 11.8 % (ref 11.6–15.4)
WBC: 4.8 10*3/uL (ref 3.4–10.8)

## 2020-01-15 LAB — LIPID PANEL
Chol/HDL Ratio: 4.1 ratio (ref 0.0–5.0)
Cholesterol, Total: 194 mg/dL (ref 100–199)
HDL: 47 mg/dL (ref >39)
LDL Chol Calc (NIH): 133 mg/dL — ABNORMAL HIGH (ref 0–99)
Triglycerides: 76 mg/dL (ref 0–149)
VLDL Cholesterol Cal: 14 mg/dL (ref 5–40)

## 2020-01-15 LAB — HIV ANTIBODY (ROUTINE TESTING W REFLEX): HIV Screen 4th Generation wRfx: NONREACTIVE

## 2020-01-15 LAB — HEPATITIS C ANTIBODY: Hep C Virus Ab: <0.1 s/co ratio (ref 0.0–0.9)

## 2020-01-15 LAB — TSH: TSH: 1.35 u[IU]/mL (ref 0.450–4.500)

## 2020-03-02 ENCOUNTER — Encounter: Payer: Self-pay | Admitting: Family Medicine

## 2020-03-02 MED ORDER — LISINOPRIL 20 MG PO TABS
20.0000 mg | ORAL_TABLET | Freq: Every day | ORAL | 1 refills | Status: DC
Start: 2020-03-02 — End: 2020-08-31

## 2020-03-02 MED ORDER — HYDROCHLOROTHIAZIDE 25 MG PO TABS
25.0000 mg | ORAL_TABLET | Freq: Every day | ORAL | 1 refills | Status: DC
Start: 2020-03-02 — End: 2020-08-26

## 2020-05-02 DIAGNOSIS — Z85828 Personal history of other malignant neoplasm of skin: Secondary | ICD-10-CM | POA: Diagnosis not present

## 2020-05-02 DIAGNOSIS — L578 Other skin changes due to chronic exposure to nonionizing radiation: Secondary | ICD-10-CM | POA: Diagnosis not present

## 2020-05-02 DIAGNOSIS — Z872 Personal history of diseases of the skin and subcutaneous tissue: Secondary | ICD-10-CM | POA: Diagnosis not present

## 2020-05-02 DIAGNOSIS — Z86018 Personal history of other benign neoplasm: Secondary | ICD-10-CM | POA: Diagnosis not present

## 2020-08-26 ENCOUNTER — Other Ambulatory Visit: Payer: Self-pay | Admitting: Family Medicine

## 2020-08-26 NOTE — Telephone Encounter (Signed)
Approved per protocol.  Requested Prescriptions  Pending Prescriptions Disp Refills  . hydrochlorothiazide (HYDRODIURIL) 25 MG tablet [Pharmacy Med Name: HYDROCHLOROTHIAZIDE 25MG  TABLETS] 90 tablet 0    Sig: TAKE 1 TABLET(25 MG) BY MOUTH DAILY     Cardiovascular: Diuretics - Thiazide Failed - 08/26/2020  3:16 AM      Failed - Valid encounter within last 6 months    Recent Outpatient Visits          7 months ago Annual physical exam   [facility] Terrilee Croak, Adriana M, PA-C   9 months ago Essential hypertension   [place], Kelby Aline, [place]   1 year ago Anal fissure   [facility], Vickki Muff, PA-C   1 year ago Essential hypertension   [facility], Dionne Bucy, MD   1 year ago Encounter for annual physical exam   [facility] [place], Dionne Bucy, MD             Passed - Ca in normal range and within 360 days    Calcium  Date Value Ref Range Status  01/14/2020 9.2 8.7 - 10.2 mg/dL Final   Calcium, Ion  Date Value Ref Range Status  04/01/2019 1.26 1.15 - 1.40 mmol/L Final         Passed - Cr in normal range and within 360 days    Creatinine, Ser  Date Value Ref Range Status  01/14/2020 0.99 0.76 - 1.27 mg/dL Final         Passed - K in normal range and within 360 days    Potassium  Date Value Ref Range Status  01/14/2020 3.8 3.5 - 5.2 mmol/L Final         Passed - Na in normal range and within 360 days    Sodium  Date Value Ref Range Status  01/14/2020 137 134 - 144 mmol/L Final         Passed - Last BP in normal range    BP Readings from Last 1 Encounters:  01/13/20 113/82

## 2020-08-31 ENCOUNTER — Other Ambulatory Visit: Payer: Self-pay | Admitting: *Deleted

## 2020-08-31 ENCOUNTER — Encounter: Payer: Self-pay | Admitting: Family Medicine

## 2020-08-31 MED ORDER — LISINOPRIL 20 MG PO TABS
20.0000 mg | ORAL_TABLET | Freq: Every day | ORAL | 0 refills | Status: DC
Start: 2020-08-31 — End: 2020-11-28

## 2020-11-26 ENCOUNTER — Other Ambulatory Visit: Payer: Self-pay | Admitting: Family Medicine

## 2020-11-26 NOTE — Telephone Encounter (Signed)
Requested medications are due for refill today yes  Requested medications are on the active medication list yes  Last refill 4/29  Last visit 12/2019  Future visit scheduled no  Notes to clinic Failed protocol due to no valid visit within 6  months, no upcoming appt scheduled.

## 2020-12-25 ENCOUNTER — Other Ambulatory Visit: Payer: Self-pay | Admitting: Family Medicine

## 2020-12-25 NOTE — Telephone Encounter (Signed)
Requested medication (s) are due for refill today: yes  Requested medication (s) are on the active medication list: yes  Last refill:  HCTZ and lisinopril: 11/28/20  Future visit scheduled: yes  Notes to clinic:  > 3 months overdue for appt   Requested Prescriptions  Pending Prescriptions Disp Refills   hydrochlorothiazide (HYDRODIURIL) 25 MG tablet [Pharmacy Med Name: HYDROCHLOROTHIAZIDE '25MG'$  TABLETS] 30 tablet 0    Sig: TAKE 1 TABLET(25 MG) BY MOUTH DAILY     Cardiovascular: Diuretics - Thiazide Failed - 12/25/2020  7:31 AM      Failed - Valid encounter within last 6 months    Recent Outpatient Visits           11 months ago Annual physical exam   [employer] [facility], Adriana M, PA-C   1 year ago Essential hypertension   [place], Kelby Aline, [place]   1 year ago Anal fissure   [facility] [facility], Vickki Muff, PA-C   1 year ago Essential hypertension   [employer] [facility], Dionne Bucy, MD   1 year ago Encounter for annual physical exam   [facility] [place], Dionne Bucy, MD       Future Appointments             In 2 months Bacigalupo, Dionne Bucy, MD [facility], [place] in normal range and within 360 days    Calcium  Date Value Ref Range Status  01/14/2020 9.2 8.7 - 10.2 mg/dL Final   Calcium, Ion  Date Value Ref Range Status  04/01/2019 1.26 1.15 - 1.40 mmol/L Final          Passed - Cr in normal range and within 360 days    Creatinine, Ser  Date Value Ref Range Status  01/14/2020 0.99 0.76 - 1.27 mg/dL Final          Passed - K in normal range and within 360 days    Potassium  Date Value Ref Range Status  01/14/2020 3.8 3.5 - 5.2 mmol/L Final          Passed - Na in normal range and within 360 days    Sodium  Date Value Ref Range Status  01/14/2020 137 134 - 144 mmol/L Final          Passed - Last BP in normal range    BP  Readings from Last 1 Encounters:  01/13/20 113/82           lisinopril (ZESTRIL) 20 MG tablet [Pharmacy Med Name: LISINOPRIL '20MG'$  TABLETS] 30 tablet 0    Sig: TAKE 1 TABLET(20 MG) BY MOUTH DAILY     Cardiovascular:  ACE Inhibitors Failed - 12/25/2020  7:31 AM      Failed - Cr in normal range and within 180 days    Creatinine, Ser  Date Value Ref Range Status  01/14/2020 0.99 0.76 - 1.27 mg/dL Final          Failed - K in normal range and within 180 days    Potassium  Date Value Ref Range Status  01/14/2020 3.8 3.5 - 5.2 mmol/L Final          Failed - Valid encounter within last 6 months    Recent Outpatient Visits           11 months ago Annual physical exam   [facility], Fabio Bering  M, PA-C   1 year ago Essential hypertension   [place] [facility], Kelby Aline, FNP   1 year ago Anal fissure   [employer] [facility], Vickki Muff, PA-C   1 year ago Essential hypertension   [place], Dionne Bucy, MD   1 year ago Encounter for annual physical exam   [facility], Dionne Bucy, MD       Future Appointments             In 2 months Bacigalupo, Dionne Bucy, MD [facility], [place] - Patient is not pregnant      Passed - Last BP in normal range    BP Readings from Last 1 Encounters:  01/13/20 113/82

## 2020-12-29 ENCOUNTER — Encounter: Payer: Self-pay | Admitting: Family Medicine

## 2020-12-29 MED ORDER — LISINOPRIL 20 MG PO TABS: ORAL_TABLET | ORAL | 0 refills | Status: DC

## 2020-12-29 MED ORDER — HYDROCHLOROTHIAZIDE 25 MG PO TABS: ORAL_TABLET | ORAL | 0 refills | Status: DC

## 2020-12-29 NOTE — Telephone Encounter (Signed)
LOV: 01/13/20 Last refill: 11/28/20 Qty:30 R:0 Future appointment:01/16/2021

## 2021-01-16 ENCOUNTER — Encounter: Payer: Self-pay | Admitting: Family Medicine

## 2021-01-16 ENCOUNTER — Ambulatory Visit (INDEPENDENT_AMBULATORY_CARE_PROVIDER_SITE_OTHER): Payer: Federal, State, Local not specified - PPO | Admitting: Family Medicine

## 2021-01-16 ENCOUNTER — Other Ambulatory Visit: Payer: Self-pay

## 2021-01-16 VITALS — BP 126/88 | HR 60 | Temp 97.8°F | Resp 15 | Ht 72.0 in | Wt 228.8 lb

## 2021-01-16 DIAGNOSIS — I10 Essential (primary) hypertension: Secondary | ICD-10-CM | POA: Diagnosis not present

## 2021-01-16 DIAGNOSIS — Z6831 Body mass index (BMI) 31.0-31.9, adult: Secondary | ICD-10-CM

## 2021-01-16 DIAGNOSIS — E7800 Pure hypercholesterolemia, unspecified: Secondary | ICD-10-CM | POA: Diagnosis not present

## 2021-01-16 DIAGNOSIS — J301 Allergic rhinitis due to pollen: Secondary | ICD-10-CM | POA: Diagnosis not present

## 2021-01-16 DIAGNOSIS — R799 Abnormal finding of blood chemistry, unspecified: Secondary | ICD-10-CM | POA: Diagnosis not present

## 2021-01-16 DIAGNOSIS — E669 Obesity, unspecified: Secondary | ICD-10-CM

## 2021-01-16 DIAGNOSIS — Z131 Encounter for screening for diabetes mellitus: Secondary | ICD-10-CM | POA: Diagnosis not present

## 2021-01-16 DIAGNOSIS — Z0000 Encounter for general adult medical examination without abnormal findings: Secondary | ICD-10-CM | POA: Diagnosis not present

## 2021-01-16 MED ORDER — LISINOPRIL-HYDROCHLOROTHIAZIDE 20-25 MG PO TABS: 1.0000 | ORAL_TABLET | Freq: Every day | ORAL | 3 refills | Status: DC

## 2021-01-16 NOTE — Assessment & Plan Note (Signed)
Denies chest pain, shortness of breath or dizziness Does not check BP at home Stable today Does not have concerns with taking medication Start combined pill given stability of BP without concerns Pt agreeable to start combined pill

## 2021-01-16 NOTE — Assessment & Plan Note (Signed)
Seasonal, takes claritin when needed Advised to add flonase- pt report that he has tried and didn't get any relief Pt educated on process of decreased inflammation d/t steroid intra nasal cavity- and the fact that it takes some time for the inflammation to go down

## 2021-01-16 NOTE — Assessment & Plan Note (Signed)
Discussed importance of healthy weight management Discussed diet and exercise  

## 2021-01-16 NOTE — Progress Notes (Signed)
Complete physical exam   Patient: Bryan Cortez   DOB: 04/22/1971   50 y.o. Male  MRN: [id number] Visit Date: 01/16/2021  Today's healthcare provider: Gwyneth Sprout, FNP   Chief Complaint  Patient presents with   Annual Exam   Subjective    Bryan Cortez is a 50 y.o. male who presents today for a complete physical exam.  He reports consuming a general diet, tries to follow low sodium but doesn't always. The patient does not participate in regular exercise at present. He generally feels well. He reports sleeping well. He does not have additional problems to discuss today.  HPI   Past Medical History:  Diagnosis Date   Arthritis    right ankle   Dental bridge present    Top front   Hyperlipidemia 11/24/2019   Hypertension    Kidney stone    Motion sickness    boats   Polyp of transverse colon    Varicose veins of left lower extremity with pain 01/13/2019   Past Surgical History:  Procedure Laterality Date   COLONOSCOPY WITH PROPOFOL N/A 01/23/2019   Procedure: COLONOSCOPY WITH BIOPSIES;  Surgeon: Lucilla Lame, MD;  Location: [place];  Service: Endoscopy;  Laterality: N/A;   POLYPECTOMY N/A 01/23/2019   Procedure: POLYPECTOMY;  Surgeon: Lucilla Lame, MD;  Location: [place];  Service: Endoscopy;  Laterality: N/A;   TESTICLE SURGERY Bilateral 1974   undistended   Social History   Socioeconomic History   Marital status: Married    Spouse name: Bryan Cortez   Number of children: 2   Years of education: some college   Highest education level: Not on file  Occupational History    Employer: [name]  Tobacco Use   Smoking status: Never   Smokeless tobacco: Never  Vaping Use   Vaping Use: Never used  Substance and Sexual Activity   Alcohol use: No    Comment: rarely   Drug use: No   Sexual activity: Yes  Other Topics Concern   Not on file  Social History Narrative   Not on file   Social Determinants of Health   Financial  Resource Strain: Not on file  Food Insecurity: Not on file  Transportation Needs: Not on file  Physical Activity: Not on file  Stress: Not on file  Social Connections: Not on file  Intimate Partner Violence: Not on file   Family Status  Relation Name Status   Father  Deceased   Sister  Alive   Mother  Deceased   Brother  Alive   Neg Hx  (Not Specified)   Family History  Problem Relation Age of Onset   Bladder Cancer Father    Heart disease Father    Cancer Father        bladder   Dementia Father    Kidney Stones Sister    Obesity Sister    Other Sister        prediabetes   Cancer Mother        unknown CA   Anxiety disorder Brother    Kidney cancer Neg Hx    Prostate cancer Neg Hx    Allergies  Allergen Reactions   Naproxen Hives    Patient Care Team: Bryan Crews, MD as PCP - General (Family Medicine)   Medications: Outpatient Medications Prior to Visit  Medication Sig   [DISCONTINUED] ALPRAZolam (XANAX) 0.5 MG tablet TAKE 1 TABLET BY MOUTH ONE HOUR PRIOR  TO PROCEDURE TAKE 2ND TABLET UPON ARRIVAL (Patient not taking: Reported on 01/13/2020)   [DISCONTINUED] ANUCORT-HC 25 MG suppository Place 25 mg rectally 2 (two) times daily. (Patient not taking: Reported on 11/24/2019)   [DISCONTINUED] hydrochlorothiazide (HYDRODIURIL) 25 MG tablet TAKE 1 TABLET(25 MG) BY MOUTH DAILY   [DISCONTINUED] lisinopril (ZESTRIL) 20 MG tablet TAKE 1 TABLET(20 MG) BY MOUTH DAILY   [DISCONTINUED] traMADol (ULTRAM) 50 MG tablet Take 1 tablet (50 mg total) by mouth every 6 (six) hours as needed. (Patient not taking: Reported on 05/21/2019)   No facility-administered medications prior to visit.    Review of Systems  HENT:  Positive for tinnitus.   Eyes:  Positive for photophobia.  All other systems reviewed and are negative.  Has been to dentist recently for teeth cleaning. Advised to go to eye doctor for routine appointment; wears reading glasses.  Advised to wear sunglasses for  complaints of photophobia.      Objective    BP 126/88   Pulse 60   Temp 97.8 F (36.6 C) (Oral)   Resp 15   Ht 6' (1.829 m)   Wt 228 lb 12.8 oz (103.8 kg)   SpO2 100%   BMI 31.03 kg/m    Physical Exam Vitals and nursing note reviewed.  Constitutional:      General: He is awake. He is not in acute distress.    Appearance: Normal appearance. He is well-developed and well-groomed. He is obese. He is not ill-appearing, toxic-appearing or diaphoretic.  HENT:     Head: Normocephalic and atraumatic.     Jaw: There is normal jaw occlusion. No trismus, tenderness, swelling or pain on movement.     Salivary Glands: Right salivary gland is not diffusely enlarged or tender. Left salivary gland is not diffusely enlarged or tender.     Comments: Chronic tinnitus; does not interfere with daily activities     Right Ear: Hearing, tympanic membrane, ear canal and external ear normal. There is no impacted cerumen.     Left Ear: Hearing, tympanic membrane, ear canal and external ear normal. There is no impacted cerumen.     Nose: Nose normal. No congestion or rhinorrhea.     Right Turbinates: Not enlarged, swollen or pale.     Left Turbinates: Not enlarged, swollen or pale.     Right Sinus: No maxillary sinus tenderness or frontal sinus tenderness.     Left Sinus: No maxillary sinus tenderness or frontal sinus tenderness.     Mouth/Throat:     Lips: Pink.     Mouth: Mucous membranes are moist. No injury, lacerations, oral lesions or angioedema.     Pharynx: Oropharynx is clear. Uvula midline. No pharyngeal swelling, oropharyngeal exudate or posterior oropharyngeal erythema.     Tonsils: No tonsillar exudate or tonsillar abscesses.  Eyes:     General: Lids are normal. Vision grossly intact. Gaze aligned appropriately.        Right eye: No discharge.        Left eye: No discharge.     Extraocular Movements: Extraocular movements intact.     Conjunctiva/sclera: Conjunctivae normal.      Pupils: Pupils are equal, round, and reactive to light.  Neck:     Thyroid: No thyroid mass, thyromegaly or thyroid tenderness.     Vascular: No carotid bruit.     Trachea: Trachea normal. No tracheal tenderness.  Cardiovascular:     Rate and Rhythm: Normal rate and regular rhythm.     Pulses: Normal  pulses.          Carotid pulses are 2+ on the right side and 2+ on the left side.      Radial pulses are 2+ on the right side and 2+ on the left side.       Femoral pulses are 2+ on the right side and 2+ on the left side.      Popliteal pulses are 2+ on the right side and 2+ on the left side.       Dorsalis pedis pulses are 2+ on the right side and 2+ on the left side.       Posterior tibial pulses are 2+ on the right side and 2+ on the left side.     Heart sounds: Normal heart sounds, S1 normal and S2 normal. No murmur heard.   No friction rub. No gallop.  Pulmonary:     Effort: Pulmonary effort is normal. No respiratory distress.     Breath sounds: Normal breath sounds and air entry. No stridor. No wheezing, rhonchi or rales.  Chest:     Chest wall: No tenderness.  Abdominal:     General: Abdomen is flat. Bowel sounds are normal. There is no distension.     Palpations: Abdomen is soft. There is no mass.     Tenderness: There is no abdominal tenderness. There is no guarding or rebound.     Hernia: No hernia is present.  Genitourinary:    Comments: Exam deferred; denies complaints Musculoskeletal:        General: No swelling, tenderness, deformity or signs of injury. Normal range of motion.     Cervical back: Normal range of motion and neck supple. No rigidity or tenderness.     Right lower leg: No edema.     Left lower leg: No edema.  Lymphadenopathy:     Cervical: No cervical adenopathy.     Right cervical: No superficial, deep or posterior cervical adenopathy.    Left cervical: No superficial, deep or posterior cervical adenopathy.  Skin:    General: Skin is warm and dry.      Capillary Refill: Capillary refill takes less than 2 seconds.     Coloration: Skin is not jaundiced or pale.     Findings: No bruising, erythema, lesion or rash.     Comments: Hx of varicose veins; s/p ablation therapy earlier this Spring S/s are much improved  Neurological:     General: No focal deficit present.     Mental Status: He is alert and oriented to person, place, and time. Mental status is at baseline.     GCS: GCS eye subscore is 4. GCS verbal subscore is 5. GCS motor subscore is 6.     Cranial Nerves: Cranial nerves are intact.     Sensory: Sensation is intact. No sensory deficit.     Motor: Motor function is intact. No weakness.     Coordination: Coordination is intact.     Gait: Gait is intact.  Psychiatric:        Attention and Perception: Attention and perception normal.        Mood and Affect: Mood and affect normal.        Speech: Speech normal.        Behavior: Behavior normal. Behavior is cooperative.        Thought Content: Thought content normal.        Cognition and Memory: Cognition normal.        Judgment: Judgment normal.  Last depression screening scores PHQ 2/9 Scores 01/16/2021 01/13/2020 01/13/2019  PHQ - 2 Score 0 0 0  PHQ- 9 Score 0 0 0   Last fall risk screening Fall Risk  01/16/2021  Falls in the past year? 0  Number falls in past yr: 0  Injury with Fall? 0  Risk for fall due to : -  Follow up -   Last Audit-C alcohol use screening Alcohol Use Disorder Test (AUDIT) 01/16/2021  1. How often do you have a drink containing alcohol? 1  2. How many drinks containing alcohol do you have on a typical day when you are drinking? 0  3. How often do you have six or more drinks on one occasion? 0  AUDIT-C Score 1   A score of 3 or more in women, and 4 or more in men indicates increased risk for alcohol abuse, EXCEPT if all of the points are from question 1   No results found for any visits on 01/16/21.  Assessment & Plan    Routine Health  Maintenance and Physical Exam  Exercise Activities and Dietary recommendations  Goals   None     Immunization History  Administered Date(s) Administered   Influenza,inj,Quad PF,6+ Mos 01/02/2018, 01/13/2019, 01/13/2020   Tdap 12/03/2013    Health Maintenance  Topic Date Due   HEMOGLOBIN A1C  Never done   COVID-19 Vaccine (1) Never done   FOOT EXAM  Never done   OPHTHALMOLOGY EXAM  Never done   Zoster Vaccines- Shingrix (1 of 2) Never done   INFLUENZA VACCINE  07/28/2021 (Originally 11/28/2020)   TETANUS/TDAP  12/04/2023   COLONOSCOPY (Pts 45-88yrs Insurance coverage will need to be confirmed)  01/23/2024   Hepatitis C Screening  Completed   HIV Screening  Completed   HPV VACCINES  Aged Out    Discussed health benefits of physical activity, and encouraged him to engage in regular exercise appropriate for his age and condition.  Problem List Items Addressed This Visit       Cardiovascular and Mediastinum   Essential hypertension    Denies chest pain, shortness of breath or dizziness Does not check BP at home Stable today Does not have concerns with taking medication Start combined pill given stability of BP without concerns Pt agreeable to start combined pill      Relevant Medications   lisinopril-hydrochlorothiazide (ZESTORETIC) 20-25 MG tablet     Respiratory   Allergic rhinitis - Primary    Seasonal, takes claritin when needed Advised to add flonase- pt report that he has tried and didn't get any relief Pt educated on process of decreased inflammation d/t steroid intra nasal cavity- and the fact that it takes some time for the inflammation to go down        Other   Obesity    Discussed importance of healthy weight management Discussed diet and exercise       Encounter for annual physical exam    Things to do to keep yourself healthy  - Exercise at least 30-45 minutes a day, 3-4 days a week.  - Eat a low-fat diet with lots of fruits and vegetables, up to  7-9 servings per day.  - Seatbelts can save your life. Wear them always.  - Smoke detectors on every level of your home, check batteries every year.  - Eye Doctor - have an eye exam every 1-2 years  - Safe sex - if you may be exposed to STDs, use a condom.  -  Alcohol -  If you drink, do it moderately, less than 2 drinks per day.  - [place]. Choose someone to speak for you if you are not able.  - Depression is common in our stressful world.If you're feeling down or losing interest in things you normally enjoy, please come in for a visit.  - Violence - If anyone is threatening or hurting you, please call immediately.        Relevant Orders   CBC with Differential/Platelet   Comprehensive metabolic panel   Hemoglobin A1c   TSH   T4, free   PSA Total (Reflex To Free)   Magnesium   Lipid panel     Return in about 1 year (around 01/16/2022) for annual examination, HTN management.     Vonna Kotyk, FNP, have reviewed all documentation for this visit. The documentation on 01/16/21 for the exam, diagnosis, procedures, and orders are all accurate and complete.    Gwyneth Sprout, [place] [phone] (phone) [phone] (fax)  Reader

## 2021-01-16 NOTE — Assessment & Plan Note (Signed)

## 2021-01-17 LAB — CBC WITH DIFFERENTIAL/PLATELET
Basophils Absolute: 0 10*3/uL (ref 0.0–0.2)
Basos: 1 %
EOS (ABSOLUTE): 0.2 10*3/uL (ref 0.0–0.4)
Eos: 4 %
Hematocrit: 44.2 % (ref 37.5–51.0)
Hemoglobin: 15.3 g/dL (ref 13.0–17.7)
Immature Grans (Abs): 0 10*3/uL (ref 0.0–0.1)
Immature Granulocytes: 0 %
Lymphocytes Absolute: 2.2 10*3/uL (ref 0.7–3.1)
Lymphs: 44 %
MCH: 31.1 pg (ref 26.6–33.0)
MCHC: 34.6 g/dL (ref 31.5–35.7)
MCV: 90 fL (ref 79–97)
Monocytes Absolute: 0.6 10*3/uL (ref 0.1–0.9)
Monocytes: 12 %
Neutrophils Absolute: 2 10*3/uL (ref 1.4–7.0)
Neutrophils: 39 %
Platelets: 208 10*3/uL (ref 150–450)
RBC: 4.92 x10E6/uL (ref 4.14–5.80)
RDW: 11.8 % (ref 11.6–15.4)
WBC: 5 10*3/uL (ref 3.4–10.8)

## 2021-01-17 LAB — MAGNESIUM: Magnesium: 2.1 mg/dL (ref 1.6–2.3)

## 2021-01-17 LAB — PSA TOTAL (REFLEX TO FREE): Prostate Specific Ag, Serum: 0.5 ng/mL (ref 0.0–4.0)

## 2021-01-17 LAB — HEMOGLOBIN A1C
Est. average glucose Bld gHb Est-mCnc: 108 mg/dL
Hgb A1c MFr Bld: 5.4 % (ref 4.8–5.6)

## 2021-01-17 LAB — LIPID PANEL
Chol/HDL Ratio: 3.9 ratio (ref 0.0–5.0)
Cholesterol, Total: 191 mg/dL (ref 100–199)
HDL: 49 mg/dL (ref >39)
LDL Chol Calc (NIH): 132 mg/dL — ABNORMAL HIGH (ref 0–99)
Triglycerides: 55 mg/dL (ref 0–149)
VLDL Cholesterol Cal: 10 mg/dL (ref 5–40)

## 2021-01-17 LAB — COMPREHENSIVE METABOLIC PANEL
ALT: 22 IU/L (ref 0–44)
AST: 19 IU/L (ref 0–40)
Albumin/Globulin Ratio: 2.4 — ABNORMAL HIGH (ref 1.2–2.2)
Albumin: 4.6 g/dL (ref 3.8–4.9)
Alkaline Phosphatase: 64 IU/L (ref 44–121)
BUN/Creatinine Ratio: 16 (ref 9–20)
BUN: 17 mg/dL (ref 6–24)
Bilirubin Total: 0.7 mg/dL (ref 0.0–1.2)
CO2: 23 mmol/L (ref 20–29)
Calcium: 9.4 mg/dL (ref 8.7–10.2)
Chloride: 101 mmol/L (ref 96–106)
Creatinine, Ser: 1.04 mg/dL (ref 0.76–1.27)
Globulin, Total: 1.9 g/dL (ref 1.5–4.5)
Glucose: 96 mg/dL (ref 65–99)
Potassium: 4.1 mmol/L (ref 3.5–5.2)
Sodium: 137 mmol/L (ref 134–144)
Total Protein: 6.5 g/dL (ref 6.0–8.5)
eGFR: 86 mL/min/{1.73_m2} (ref >59)

## 2021-01-17 LAB — T4, FREE: Free T4: 1.12 ng/dL (ref 0.82–1.77)

## 2021-01-17 LAB — TSH: TSH: 0.824 u[IU]/mL (ref 0.450–4.500)

## 2021-01-18 ENCOUNTER — Encounter: Payer: Self-pay | Admitting: Family Medicine

## 2021-01-26 ENCOUNTER — Other Ambulatory Visit: Payer: Self-pay | Admitting: Family Medicine

## 2021-01-26 NOTE — Telephone Encounter (Signed)
Requested medications are due for refill today.  no  Requested medications are on the active medications list. no  Last refill. Both were last filled 12/29/2020  Future visit scheduled.   no  Notes to clinic.  Both medications were d/c'd 01/16/2021. Pt was prescribed a combination medication.

## 2021-02-02 ENCOUNTER — Encounter: Payer: Self-pay | Admitting: General Surgery

## 2021-02-27 ENCOUNTER — Encounter: Payer: Federal, State, Local not specified - PPO | Admitting: Family Medicine

## 2021-03-20 ENCOUNTER — Other Ambulatory Visit: Payer: Self-pay | Admitting: Family Medicine

## 2021-03-20 DIAGNOSIS — Z6831 Body mass index (BMI) 31.0-31.9, adult: Secondary | ICD-10-CM

## 2021-03-20 DIAGNOSIS — E669 Obesity, unspecified: Secondary | ICD-10-CM

## 2021-05-02 DIAGNOSIS — Z872 Personal history of diseases of the skin and subcutaneous tissue: Secondary | ICD-10-CM | POA: Diagnosis not present

## 2021-05-02 DIAGNOSIS — L578 Other skin changes due to chronic exposure to nonionizing radiation: Secondary | ICD-10-CM | POA: Diagnosis not present

## 2021-05-02 DIAGNOSIS — D225 Melanocytic nevi of trunk: Secondary | ICD-10-CM | POA: Diagnosis not present

## 2021-05-02 DIAGNOSIS — L570 Actinic keratosis: Secondary | ICD-10-CM | POA: Diagnosis not present

## 2021-05-02 DIAGNOSIS — D485 Neoplasm of uncertain behavior of skin: Secondary | ICD-10-CM | POA: Diagnosis not present

## 2021-05-31 DIAGNOSIS — D235 Other benign neoplasm of skin of trunk: Secondary | ICD-10-CM | POA: Diagnosis not present

## 2021-05-31 DIAGNOSIS — L988 Other specified disorders of the skin and subcutaneous tissue: Secondary | ICD-10-CM | POA: Diagnosis not present

## 2021-11-02 DIAGNOSIS — Z86018 Personal history of other benign neoplasm: Secondary | ICD-10-CM | POA: Diagnosis not present

## 2021-11-02 DIAGNOSIS — Z85828 Personal history of other malignant neoplasm of skin: Secondary | ICD-10-CM | POA: Diagnosis not present

## 2021-11-02 DIAGNOSIS — L578 Other skin changes due to chronic exposure to nonionizing radiation: Secondary | ICD-10-CM | POA: Diagnosis not present

## 2021-11-02 DIAGNOSIS — L821 Other seborrheic keratosis: Secondary | ICD-10-CM | POA: Diagnosis not present

## 2022-01-16 ENCOUNTER — Other Ambulatory Visit: Payer: Self-pay | Admitting: Family Medicine

## 2022-01-16 DIAGNOSIS — I10 Essential (primary) hypertension: Secondary | ICD-10-CM

## 2022-01-18 ENCOUNTER — Encounter: Payer: Self-pay | Admitting: Family Medicine

## 2022-01-18 ENCOUNTER — Ambulatory Visit (INDEPENDENT_AMBULATORY_CARE_PROVIDER_SITE_OTHER): Payer: Federal, State, Local not specified - PPO | Admitting: Family Medicine

## 2022-01-18 VITALS — BP 118/81 | HR 58 | Temp 98.0°F | Resp 16 | Ht 72.0 in | Wt 226.5 lb

## 2022-01-18 DIAGNOSIS — E669 Obesity, unspecified: Secondary | ICD-10-CM

## 2022-01-18 DIAGNOSIS — Z6831 Body mass index (BMI) 31.0-31.9, adult: Secondary | ICD-10-CM

## 2022-01-18 DIAGNOSIS — I10 Essential (primary) hypertension: Secondary | ICD-10-CM

## 2022-01-18 DIAGNOSIS — Z0000 Encounter for general adult medical examination without abnormal findings: Secondary | ICD-10-CM

## 2022-01-18 NOTE — Patient Instructions (Signed)

## 2022-01-18 NOTE — Assessment & Plan Note (Signed)
Patient presenting for annual physical: We will check lipid panel and CMP Patient has fasting states 2130  HIV previously collected and negative Hepatitis C was negative in 2021 Colonoscopy in 2020, notable for 2 polyps, recommended for repeat colonoscopy in 2025 for adenoma

## 2022-01-18 NOTE — Assessment & Plan Note (Signed)
BMI 30.7 to, down from 31 CMP and lipid panel collected today Patient does resistance training and cardio 4 times per week

## 2022-01-18 NOTE — Assessment & Plan Note (Signed)
Chronic, stable Blood pressure within normal limits today CMP collected today He will continue Zestoretic 20-25 mg daily

## 2022-01-18 NOTE — Progress Notes (Signed)
Complete physical exam  I,Bryan Cortez,acting as a scribe for [employer], MD.,have documented all relevant documentation on the behalf of Bryan Foster, MD,as directed by  Bryan Foster, MD while in the presence of Bryan Foster, MD.   Patient: Bryan Cortez   DOB: 07/04/1970   51 y.o. Male  MRN: [id number] Visit Date: 01/18/2022  Today's healthcare provider: [name], MD   Chief Complaint  Patient presents with   Annual Exam   Subjective    Bryan Cortez is a 51 y.o. male who presents today for a complete physical exam.  He reports consuming a  balanced  diet. Gym/ health club routine includes cardio and mod to heavy weightlifting. 4 times a week but does takes some breaks in between. He generally feels well. He reports sleeping well. He does not have additional problems to discuss today. Reports that rarely he gets lightheaded when standing up quick but this quickly resolves. These episodes are not associated with chest pain, HA, SOB, or dizziness. He states that he maintains oral hydration mostly while working but sometimes drinks less when he is not working.  He reports adherence to his blood pressure medication Zestoretic.  Past Medical History:  Diagnosis Date   Arthritis    right ankle   Dental bridge present    Top front   Hyperlipidemia 11/24/2019   Hypertension    Kidney stone    Motion sickness    boats   Polyp of transverse colon    Varicose veins of left lower extremity with pain 01/13/2019   Past Surgical History:  Procedure Laterality Date   COLONOSCOPY WITH PROPOFOL N/A 01/23/2019   Procedure: COLONOSCOPY WITH BIOPSIES;  Surgeon: Lucilla Lame, MD;  Location: [place];  Service: Endoscopy;  Laterality: N/A;   POLYPECTOMY N/A 01/23/2019   Procedure: POLYPECTOMY;  Surgeon: Lucilla Lame, MD;  Location: [place];  Service: Endoscopy;  Laterality: N/A;   TESTICLE SURGERY  Bilateral 1974   undistended   Social History   Socioeconomic History   Marital status: Married    Spouse name: Dayna Geurts   Number of children: 2   Years of education: some college   Highest education level: Not on file  Occupational History    Employer: [name]  Tobacco Use   Smoking status: Never   Smokeless tobacco: Never  Vaping Use   Vaping Use: Never used  Substance and Sexual Activity   Alcohol use: No    Comment: rarely   Drug use: No   Sexual activity: Yes  Other Topics Concern   Not on file  Social History Narrative   Not on file   Social Determinants of Health   Financial Resource Strain: Not on file  Food Insecurity: Not on file  Transportation Needs: Not on file  Physical Activity: Not on file  Stress: Not on file  Social Connections: Not on file  Intimate Partner Violence: Not on file   Family Status  Relation Name Status   Father  Deceased   Sister  Alive   Mother  Deceased   Brother  Alive   Neg Hx  (Not Specified)   Family History  Problem Relation Age of Onset   Bladder Cancer Father    Heart disease Father    Cancer Father        bladder   Dementia Father    Kidney Stones Sister    Obesity Sister    Other Sister  prediabetes   Cancer Mother        unknown CA   Anxiety disorder Brother    Kidney cancer Neg Hx    Prostate cancer Neg Hx    Allergies  Allergen Reactions   Naproxen Hives    Patient Care Team: Virginia Crews, MD as PCP - General (Family Medicine)   Medications: Outpatient Medications Prior to Visit  Medication Sig   lisinopril-hydrochlorothiazide (ZESTORETIC) 20-25 MG tablet TAKE 1 TABLET BY MOUTH DAILY   No facility-administered medications prior to visit.    Review of Systems  Allergic/Immunologic: Positive for environmental allergies.  All other systems reviewed and are negative.     Objective     BP 118/81 (BP Location: Left Arm, Patient Position: Sitting, Cuff Size: Large)    Pulse (!) 58   Temp 98 F (36.7 C) (Oral)   Resp 16   Ht 6' (1.829 m)   Wt 226 lb 8 oz (102.7 kg)   BMI 30.72 kg/m     Physical Exam Vitals reviewed.  Constitutional:      General: He is not in acute distress.    Appearance: Normal appearance. He is not ill-appearing, toxic-appearing or diaphoretic.  HENT:     Head: Normocephalic and atraumatic.     Right Ear: Tympanic membrane and external ear normal.     Left Ear: Tympanic membrane and external ear normal.     Nose: Nose normal.     Mouth/Throat:     Mouth: Mucous membranes are moist.     Pharynx: No oropharyngeal exudate or posterior oropharyngeal erythema.  Eyes:     General: No scleral icterus.       Right eye: No discharge.        Left eye: No discharge.     Extraocular Movements: Extraocular movements intact.     Conjunctiva/sclera: Conjunctivae normal.     Pupils: Pupils are equal, round, and reactive to light.  Cardiovascular:     Rate and Rhythm: Normal rate and regular rhythm.     Pulses: Normal pulses.     Heart sounds: Normal heart sounds. No murmur heard.    No friction rub. No gallop.  Pulmonary:     Effort: Pulmonary effort is normal. No respiratory distress.     Breath sounds: Normal breath sounds. No wheezing or rales.  Abdominal:     General: Bowel sounds are normal. There is no distension.     Palpations: Abdomen is soft. There is no mass.     Tenderness: There is no abdominal tenderness.  Musculoskeletal:        General: No swelling, tenderness or signs of injury. Normal range of motion.     Cervical back: Normal range of motion and neck supple. No rigidity or tenderness.  Lymphadenopathy:     Cervical: No cervical adenopathy.  Skin:    General: Skin is warm and dry.     Coloration: Skin is not jaundiced or pale.     Findings: No bruising, erythema, lesion or rash.  Neurological:     General: No focal deficit present.     Mental Status: He is alert and oriented to person, place, and time.      Cranial Nerves: No cranial nerve deficit.     Sensory: No sensory deficit.     Motor: No weakness.     Gait: Gait normal.  Psychiatric:        Mood and Affect: Mood normal.  Behavior: Behavior normal.      Last depression screening scores    01/18/2022    8:18 AM 01/16/2021    9:53 AM 01/13/2020    3:45 PM  PHQ 2/9 Scores  PHQ - 2 Score 0 0 0  PHQ- 9 Score  0 0   Last fall risk screening    01/18/2022    8:18 AM  Fall Risk   Falls in the past year? 0  Number falls in past yr: 0  Injury with Fall? 0  Risk for fall due to : No Fall Risks   Last Audit-C alcohol use screening    01/16/2021    9:53 AM  Alcohol Use Disorder Test (AUDIT)  1. How often do you have a drink containing alcohol? 1  2. How many drinks containing alcohol do you have on a typical day when you are drinking? 0  3. How often do you have six or more drinks on one occasion? 0  AUDIT-C Score 1   A score of 3 or more in women, and 4 or more in men indicates increased risk for alcohol abuse, EXCEPT if all of the points are from question 1   No results found for any visits on 01/18/22.  Assessment & Plan    Routine Health Maintenance and Physical Exam Immunization History  Administered Date(s) Administered   Influenza,inj,Quad PF,6+ Mos 01/02/2018, 01/13/2019, 01/13/2020   Tdap 12/03/2013    Health Maintenance  Topic Date Due   COVID-19 Vaccine (1) Never done   Zoster Vaccines- Shingrix (1 of 2) Never done   INFLUENZA VACCINE  07/29/2022 (Originally 11/28/2021)   TETANUS/TDAP  12/04/2023   COLONOSCOPY (Pts 45-47yr Insurance coverage will need to be confirmed)  01/23/2024   Hepatitis C Screening  Completed   HIV Screening  Completed   HPV VACCINES  Aged Out    Discussed health benefits of physical activity, and encouraged him to engage in regular exercise appropriate for his age and condition.   I, MEulis Foster MD, have reviewed all documentation for this visit. The  documentation on 01/18/22 for the exam, diagnosis, procedures, and orders are all accurate and complete.    MEulis Foster MD  [phone](phone) [phone](fax)  [place]

## 2022-01-19 LAB — COMPREHENSIVE METABOLIC PANEL
ALT: 24 IU/L (ref 0–44)
AST: 19 IU/L (ref 0–40)
Albumin/Globulin Ratio: 1.8 (ref 1.2–2.2)
Albumin: 4.5 g/dL (ref 3.8–4.9)
Alkaline Phosphatase: 70 IU/L (ref 44–121)
BUN/Creatinine Ratio: 15 (ref 9–20)
BUN: 18 mg/dL (ref 6–24)
Bilirubin Total: 0.6 mg/dL (ref 0.0–1.2)
CO2: 24 mmol/L (ref 20–29)
Calcium: 9.4 mg/dL (ref 8.7–10.2)
Chloride: 103 mmol/L (ref 96–106)
Creatinine, Ser: 1.21 mg/dL (ref 0.76–1.27)
Globulin, Total: 2.5 g/dL (ref 1.5–4.5)
Glucose: 87 mg/dL (ref 70–99)
Potassium: 4.2 mmol/L (ref 3.5–5.2)
Sodium: 142 mmol/L (ref 134–144)
Total Protein: 7 g/dL (ref 6.0–8.5)
eGFR: 72 mL/min/{1.73_m2} (ref >59)

## 2022-01-19 LAB — LIPID PANEL
Chol/HDL Ratio: 4 ratio (ref 0.0–5.0)
Cholesterol, Total: 194 mg/dL (ref 100–199)
HDL: 49 mg/dL (ref >39)
LDL Chol Calc (NIH): 131 mg/dL — ABNORMAL HIGH (ref 0–99)
Triglycerides: 74 mg/dL (ref 0–149)
VLDL Cholesterol Cal: 14 mg/dL (ref 5–40)

## 2022-02-26 ENCOUNTER — Encounter (INDEPENDENT_AMBULATORY_CARE_PROVIDER_SITE_OTHER): Payer: Self-pay

## 2022-03-13 DIAGNOSIS — S63501A Unspecified sprain of right wrist, initial encounter: Secondary | ICD-10-CM | POA: Diagnosis not present

## 2022-03-20 DIAGNOSIS — S63501D Unspecified sprain of right wrist, subsequent encounter: Secondary | ICD-10-CM | POA: Diagnosis not present

## 2022-03-30 DIAGNOSIS — M25531 Pain in right wrist: Secondary | ICD-10-CM | POA: Diagnosis not present

## 2022-03-30 DIAGNOSIS — S63501A Unspecified sprain of right wrist, initial encounter: Secondary | ICD-10-CM | POA: Diagnosis not present

## 2022-04-10 DIAGNOSIS — S63501A Unspecified sprain of right wrist, initial encounter: Secondary | ICD-10-CM | POA: Diagnosis not present

## 2022-04-17 ENCOUNTER — Ambulatory Visit
Admission: EM | Admit: 2022-04-17 | Discharge: 2022-04-17 | Disposition: A | Discharge status: Home / Self Care | Payer: Federal, State, Local not specified - PPO | Attending: Urgent Care | Admitting: Urgent Care

## 2022-04-17 DIAGNOSIS — Z1152 Encounter for screening for COVID-19: Secondary | ICD-10-CM | POA: Insufficient documentation

## 2022-04-17 DIAGNOSIS — R509 Fever, unspecified: Secondary | ICD-10-CM | POA: Diagnosis not present

## 2022-04-17 DIAGNOSIS — R6889 Other general symptoms and signs: Secondary | ICD-10-CM | POA: Diagnosis not present

## 2022-04-17 DIAGNOSIS — R059 Cough, unspecified: Secondary | ICD-10-CM | POA: Insufficient documentation

## 2022-04-17 LAB — RESP PANEL BY RT-PCR (FLU A&B, COVID) ARPGX2
Influenza A by PCR: NEGATIVE
Influenza B by PCR: NEGATIVE
SARS Coronavirus 2 by RT PCR: NEGATIVE

## 2022-04-17 NOTE — ED Triage Notes (Signed)
Pt. Presents to UC w/ c/o chills, body aches, and a cough for the past 5 days.

## 2022-04-17 NOTE — Discharge Instructions (Signed)
You have been diagnosed with a viral upper respiratory infection based on your symptoms and exam. Viral illnesses cannot be treated with antibiotics - they are self limiting - and you should find your symptoms resolving within a few days. Get plenty of rest and non-caffeinated fluids.  We have performed a respiratory swab checking for COVID, and influenza.  Given the duration of your symptoms, you are already outside of the window for treatment with antiviral therapy.  We recommend you use over-the-counter medications for symptom control including Tylenol or ibuprofen for fever, chills or body aches, and cold/cough medication.  Saline mist spray is helpful for removing excess mucus from your nose.  Room humidifiers are helpful to ease breathing at night. You might also find relief of nasal/sinus congestion symptoms by using a nasal decongestant such as Sudafed sinus (pseudoephedrine).  You will need to obtain this medication from behind the pharmacist counter.  Speak to the pharmacist to verify that you are not duplicating medications with other over-the-counter formulations that you may be using.   Follow up here or with your primary care provider if your symptoms are worsening or not improving.

## 2022-04-17 NOTE — Progress Notes (Deleted)
      Established patient visit   Patient: Bryan Cortez   DOB: [birth year]   51 y.o. Male  MRN: [id number] Visit Date: 04/19/2022  Today's healthcare provider: [name], MD   No chief complaint on file.  Subjective    HPI  Follow-Up elevated cholesterol: Patient presents for 3 months follow up.  His last labs showed Total cholesterol of 194, HDL 49, LDL 131,  Triglycerides 74. {symptoms; cardiac:18093}. There {is/is not:9024} a family history of hyperlipidemia. There {is/is not:9024} a family history of early ischemia heart disease.   Medications: Outpatient Medications Prior to Visit  Medication Sig   lisinopril-hydrochlorothiazide (ZESTORETIC) 20-25 MG tablet TAKE 1 TABLET BY MOUTH DAILY   No facility-administered medications prior to visit.    Review of Systems  {Labs  Heme  Chem  Endocrine  Serology  Results Review (optional):23779}   Objective    There were no vitals taken for this visit. {Show previous vital signs (optional):23777}  Physical Exam  ***  No results found for any visits on 04/19/22.  Assessment & Plan     ***  No follow-ups on file.      {provider attestation***:1}   [name], MD  [facility] [phone] (phone) [phone] (fax)  [place]

## 2022-04-17 NOTE — ED Provider Notes (Signed)
Bryan Cortez    CSN: [id number] Arrival date & time: 04/17/22  3846      History   Chief Complaint Chief Complaint  Patient presents with   Cough   Generalized Body Aches   Chills    HPI Bryan Cortez is a 51 y.o. male.    Cough   Presents to urgent care with complaint of chills, body aches, cough x 5 days.  Patient is accompanied by his wife who presents with similar symptoms since yesterday.  Fever.  He states most of his symptoms seem to be in his chest and has difficulty eating mucus out.  Denies nausea, vomiting, diarrhea.   Past Medical History:  Diagnosis Date   Arthritis    right ankle   Dental bridge present    Top front   Hyperlipidemia 11/24/2019   Hypertension    Kidney stone    Motion sickness    boats   Polyp of transverse colon    Varicose veins of left lower extremity with pain 01/13/2019    Patient Active Problem List   Diagnosis Date Noted   Encounter for annual physical exam 01/16/2021   Essential hypertension 01/16/2021   Obesity 12/12/2016   Allergic rhinitis 11/24/2015    Past Surgical History:  Procedure Laterality Date   COLONOSCOPY WITH PROPOFOL N/A 01/23/2019   Procedure: COLONOSCOPY WITH BIOPSIES;  Surgeon: Lucilla Lame, MD;  Location: [place];  Service: Endoscopy;  Laterality: N/A;   POLYPECTOMY N/A 01/23/2019   Procedure: POLYPECTOMY;  Surgeon: Lucilla Lame, MD;  Location: [place];  Service: Endoscopy;  Laterality: N/A;   TESTICLE SURGERY Bilateral 1974   undistended       Home Medications    Prior to Admission medications   Medication Sig Start Date End Date Taking? Authorizing Provider  lisinopril-hydrochlorothiazide (ZESTORETIC) 20-25 MG tablet TAKE 1 TABLET BY MOUTH DAILY 01/16/22   Simmons-Robinson, Riki Sheer, MD    Family History Family History  Problem Relation Age of Onset   Bladder Cancer Father    Heart disease Father    Cancer Father        bladder   Dementia Father     Kidney Stones Sister    Obesity Sister    Other Sister        prediabetes   Cancer Mother        unknown CA   Anxiety disorder Brother    Kidney cancer Neg Hx    Prostate cancer Neg Hx     Social History Social History   Tobacco Use   Smoking status: Never   Smokeless tobacco: Never  Vaping Use   Vaping Use: Never used  Substance Use Topics   Alcohol use: No    Comment: rarely   Drug use: No     Allergies   Naproxen   Review of Systems Review of Systems  Respiratory:  Positive for cough.      Physical Exam Triage Vital Signs ED Triage Vitals  Enc Vitals Group     BP 04/17/22 0952 130/86     Pulse Rate 04/17/22 0952 60     Resp 04/17/22 0952 18     Temp 04/17/22 0952 98.6 F (37 C)     Temp Source 04/17/22 0952 Oral     SpO2 04/17/22 0952 96 %     Weight --      Height --      Head Circumference --      Peak Flow --  Pain Score 04/17/22 0955 0     Pain Loc --      Pain Edu? --      Excl. in [place]? --    No data found.  Updated Vital Signs BP 130/86 (BP Location: Left Arm)   Pulse 60   Temp 98.6 F (37 C) (Oral)   Resp 18   SpO2 96%   Visual Acuity Right Eye Distance:   Left Eye Distance:   Bilateral Distance:    Right Eye Near:   Left Eye Near:    Bilateral Near:     Physical Exam Vitals reviewed.  Constitutional:      Appearance: He is ill-appearing.  HENT:     Mouth/Throat:     Pharynx: Posterior oropharyngeal erythema present. No oropharyngeal exudate.  Cardiovascular:     Rate and Rhythm: Normal rate and regular rhythm.     Pulses: Normal pulses.     Heart sounds: Normal heart sounds.  Pulmonary:     Effort: Pulmonary effort is normal.     Breath sounds: Normal breath sounds.  Skin:    General: Skin is warm and dry.  Neurological:     General: No focal deficit present.     Mental Status: He is alert and oriented to person, place, and time.  Psychiatric:        Mood and Affect: Mood normal.        Behavior: Behavior  normal.      UC Treatments / Results  Labs (all labs ordered are listed, but only abnormal results are displayed) Labs Reviewed  RESP PANEL BY RT-PCR (FLU A&B, COVID) [id number]    EKG   Radiology No results found.  Procedures Procedures (including critical care time)  Medications Ordered in UC Medications - No data to display  Initial Impression / Assessment and Plan / UC Course  I have reviewed the triage vital signs and the nursing notes.  Pertinent labs & imaging results that were available during my care of the patient were reviewed by me and considered in my medical decision making (see chart for details).   Patient is afebrile here without recent antipyretics. Satting well on room air. Overall is ill appearing, though well hydrated and without respiratory distress. Pulmonary exam is unremarkable.  Lungs CTAB without wheezes, rhonchi, rales.  TMs WNL bilaterally.  Some pharyngeal erythema without peritonsillar exudates.  Suspect viral process including influenza though he is outside of the window for treatment with Tamiflu.  Results of respiratory swab are pending.  Recommending continued treatment with OTC medications including guaifenesin as mucolytic and cough suppressant.  Discussed prescription for additional cough suppressants but patient declined.   Final Clinical Impressions(s) / UC Diagnoses   Final diagnoses:  Flu-like symptoms   Discharge Instructions   None    ED Prescriptions   None    PDMP not reviewed this encounter.   Rose Phi,  04/17/22 1048

## 2022-04-19 ENCOUNTER — Ambulatory Visit: Payer: Federal, State, Local not specified - PPO | Admitting: Family Medicine

## 2022-05-03 DIAGNOSIS — J101 Influenza due to other identified influenza virus with other respiratory manifestations: Secondary | ICD-10-CM | POA: Diagnosis not present

## 2022-05-03 DIAGNOSIS — Z85828 Personal history of other malignant neoplasm of skin: Secondary | ICD-10-CM | POA: Diagnosis not present

## 2022-05-03 DIAGNOSIS — Z872 Personal history of diseases of the skin and subcutaneous tissue: Secondary | ICD-10-CM | POA: Diagnosis not present

## 2022-05-03 DIAGNOSIS — Z86018 Personal history of other benign neoplasm: Secondary | ICD-10-CM | POA: Diagnosis not present

## 2022-05-03 DIAGNOSIS — L578 Other skin changes due to chronic exposure to nonionizing radiation: Secondary | ICD-10-CM | POA: Diagnosis not present

## 2022-07-11 NOTE — Progress Notes (Unsigned)
      Established patient visit   Patient: Bryan Cortez   DOB: 04-Nov-1970   52 y.o. Male  MRN: [id number] Visit Date: 07/12/2022  Today's healthcare provider: Shirlee Latch, MD   No chief complaint on file.  Subjective    HPI  Wrist Pain: Patient complaints of {left/right/bi:30031} wrist pain. This is evaluated as a {personal injury, wc:15878} injury. The pain began {numbers 0-10:33138} {units:11} ago. The pain is located primarily in the {Anatomy; locations wrist:15952}.  He describes the symptoms as {pain character:10965}. Symptoms improve with {ortho pain lower relieved by:12933}. The symptoms are worse with {Ue aggravated by:60742}. The patient  {has/not:15037} neck pain. The patient is active in {sports:10024}. Treatment to date has been {tx:10285}, {with-without:5700} significant relief.   Medications: Outpatient Medications Prior to Visit  Medication Sig   lisinopril-hydrochlorothiazide (ZESTORETIC) 20-25 MG tablet TAKE 1 TABLET BY MOUTH DAILY   No facility-administered medications prior to visit.    Review of Systems  {Labs  Heme  Chem  Endocrine  Serology  Results Review (optional):23779}   Objective    There were no vitals taken for this visit. {Show previous vital signs (optional):23777}  Physical Exam  ***  No results found for any visits on 07/12/22.  Assessment & Plan     ***  No follow-ups on file.      {provider attestation***:1}   Shirlee Latch, MD  [facility] [phone] (phone) [phone] (fax)  [facility]

## 2022-07-12 ENCOUNTER — Ambulatory Visit: Payer: Federal, State, Local not specified - PPO | Admitting: Family Medicine

## 2022-07-12 ENCOUNTER — Encounter: Payer: Self-pay | Admitting: Family Medicine

## 2022-07-12 VITALS — BP 123/84 | HR 50 | Temp 97.6°F | Resp 12 | Wt 229.0 lb

## 2022-07-12 DIAGNOSIS — I10 Essential (primary) hypertension: Secondary | ICD-10-CM | POA: Diagnosis not present

## 2022-07-12 DIAGNOSIS — M25531 Pain in right wrist: Secondary | ICD-10-CM | POA: Insufficient documentation

## 2022-07-12 MED ORDER — LISINOPRIL-HYDROCHLOROTHIAZIDE 20-25 MG PO TABS: 1.0000 | ORAL_TABLET | Freq: Every day | ORAL | 3 refills | Status: DC

## 2022-07-12 NOTE — Assessment & Plan Note (Signed)
Hypertension well controlled. Continue current medication regimen.

## 2022-07-12 NOTE — Progress Notes (Signed)
   Acute Office Visit  Subjective:     Patient ID: Bryan Cortez, male    DOB: 03-16-1971, 52 y.o.   MRN: [id number]  Chief Complaint  Patient presents with   Wrist Pain    Wrist Pain  [place] on an outstretched right hand in September.  Pain at the dorsal radial aspect of the right wrist over the scaphoid.  Pain is constant and dull with increases to sharp 8/10 pain with supination while gripping and radial deviation.  MRI at the time showed preexisting osteoarthritis and a ligament tear.  Steroid injection in December provided moderate relief for a month.  Ibuprofen use provides moderate relief. Has a brace at home but does not consistently wear it.    ROS As noted in HPI         Objective:    BP 123/84 (BP Location: Left Arm, Patient Position: Sitting, Cuff Size: Large)   Pulse (!) 50   Temp 97.6 F (36.4 C) (Temporal)   Resp 12   Wt 229 lb (103.9 kg)   BMI 31.06 kg/m    Physical Exam Cardiovascular:     Pulses: Normal pulses.     Heart sounds: Normal heart sounds.  Musculoskeletal:     Comments: Right wrist: Mild swelling, full ROM, strength 5/5, no tenderness to palpation, pain with abduction of the thumb.  Left wrist: No swelling, full ROM, strength 5/5, no tenderness to palpation.   Neurological:     Mental Status: He is alert.    No results found for any visits on 07/12/22.      Assessment & Plan:   Problem List Items Addressed This Visit       Cardiovascular and Mediastinum   Essential hypertension    Hypertension well controlled. Continue current medication regimen.       Relevant Medications   lisinopril-hydrochlorothiazide (ZESTORETIC) 20-25 MG tablet     Other   Right wrist pain - Primary    Pain is likely due to ligament tear noted on previous imaging, but could also be from a scaphoid fracture or osteoarthritis.  Refer to orthopedics for repeat imaging and follow up on potential surgical interventions. Conservative treatment with ice  and ibuprofen for pain.  Wear brace when not working.        Relevant Orders   Ambulatory referral to Hand Surgery    Meds ordered this encounter  Medications   lisinopril-hydrochlorothiazide (ZESTORETIC) 20-25 MG tablet    Sig: Take 1 tablet by mouth daily.    Dispense:  90 tablet    Refill:  3    Return in about 6 months (around 01/12/2023) for CPE.  Wynona Dove, Medical Student   Patient seen along with MS3 student Everlene Balls. I personally evaluated this patient along with the student, and verified all aspects of the history, physical exam, and medical decision making as documented by the student. I agree with the student's documentation and have made all necessary edits.  Kimesha Claxton, Dionne Bucy, MD, MPH [facility]

## 2022-07-12 NOTE — Assessment & Plan Note (Signed)
Pain is likely due to ligament tear noted on previous imaging, but could also be from a scaphoid fracture or osteoarthritis.  Refer to orthopedics for repeat imaging and follow up on potential surgical interventions. Conservative treatment with ice and ibuprofen for pain.  Wear brace when not working.

## 2022-07-19 DIAGNOSIS — M25531 Pain in right wrist: Secondary | ICD-10-CM | POA: Diagnosis not present

## 2022-07-19 DIAGNOSIS — M13831 Other specified arthritis, right wrist: Secondary | ICD-10-CM | POA: Diagnosis not present

## 2022-08-17 DIAGNOSIS — M19031 Primary osteoarthritis, right wrist: Secondary | ICD-10-CM | POA: Diagnosis not present

## 2022-08-17 DIAGNOSIS — M25331 Other instability, right wrist: Secondary | ICD-10-CM | POA: Diagnosis not present

## 2022-08-17 DIAGNOSIS — M958 Other specified acquired deformities of musculoskeletal system: Secondary | ICD-10-CM | POA: Diagnosis not present

## 2022-08-30 DIAGNOSIS — G8918 Other acute postprocedural pain: Secondary | ICD-10-CM | POA: Diagnosis not present

## 2022-08-30 DIAGNOSIS — M25531 Pain in right wrist: Secondary | ICD-10-CM | POA: Diagnosis not present

## 2022-09-03 DIAGNOSIS — M13831 Other specified arthritis, right wrist: Secondary | ICD-10-CM | POA: Diagnosis not present

## 2022-09-27 DIAGNOSIS — M25531 Pain in right wrist: Secondary | ICD-10-CM | POA: Diagnosis not present

## 2022-10-04 DIAGNOSIS — M25641 Stiffness of right hand, not elsewhere classified: Secondary | ICD-10-CM | POA: Diagnosis not present

## 2022-10-12 DIAGNOSIS — M25531 Pain in right wrist: Secondary | ICD-10-CM | POA: Diagnosis not present

## 2022-10-19 DIAGNOSIS — M25531 Pain in right wrist: Secondary | ICD-10-CM | POA: Diagnosis not present

## 2022-10-23 DIAGNOSIS — M25531 Pain in right wrist: Secondary | ICD-10-CM | POA: Diagnosis not present

## 2022-10-30 DIAGNOSIS — M25531 Pain in right wrist: Secondary | ICD-10-CM | POA: Diagnosis not present

## 2022-11-08 DIAGNOSIS — M13831 Other specified arthritis, right wrist: Secondary | ICD-10-CM | POA: Diagnosis not present

## 2022-11-09 DIAGNOSIS — S62101K Fracture of unspecified carpal bone, right wrist, subsequent encounter for fracture with nonunion: Secondary | ICD-10-CM | POA: Diagnosis not present

## 2022-11-09 DIAGNOSIS — G8918 Other acute postprocedural pain: Secondary | ICD-10-CM | POA: Diagnosis not present

## 2022-11-09 DIAGNOSIS — T84298A Other mechanical complication of internal fixation device of other bones, initial encounter: Secondary | ICD-10-CM | POA: Diagnosis not present

## 2022-11-09 DIAGNOSIS — M19031 Primary osteoarthritis, right wrist: Secondary | ICD-10-CM | POA: Diagnosis not present

## 2022-11-14 ENCOUNTER — Encounter: Payer: Self-pay | Admitting: Family Medicine

## 2022-11-15 ENCOUNTER — Other Ambulatory Visit: Payer: Self-pay | Admitting: Family Medicine

## 2022-11-15 MED ORDER — HYDROXYZINE PAMOATE 25 MG PO CAPS: 25.0000 mg | ORAL_CAPSULE | Freq: Every evening | ORAL | 0 refills | Status: DC | PRN

## 2022-11-22 DIAGNOSIS — M25531 Pain in right wrist: Secondary | ICD-10-CM | POA: Diagnosis not present

## 2022-11-22 DIAGNOSIS — M13831 Other specified arthritis, right wrist: Secondary | ICD-10-CM | POA: Diagnosis not present

## 2022-12-05 DIAGNOSIS — L298 Other pruritus: Secondary | ICD-10-CM | POA: Diagnosis not present

## 2022-12-05 DIAGNOSIS — L821 Other seborrheic keratosis: Secondary | ICD-10-CM | POA: Diagnosis not present

## 2022-12-06 DIAGNOSIS — M13831 Other specified arthritis, right wrist: Secondary | ICD-10-CM | POA: Diagnosis not present

## 2022-12-18 DIAGNOSIS — M25641 Stiffness of right hand, not elsewhere classified: Secondary | ICD-10-CM | POA: Diagnosis not present

## 2023-01-07 ENCOUNTER — Other Ambulatory Visit: Payer: Self-pay | Admitting: Orthopedic Surgery

## 2023-01-07 ENCOUNTER — Ambulatory Visit
Admission: RE | Admit: 2023-01-07 | Discharge: 2023-01-07 | Disposition: A | Discharge status: Home / Self Care | Payer: Federal, State, Local not specified - PPO | Source: Ambulatory Visit | Attending: Orthopedic Surgery

## 2023-01-07 DIAGNOSIS — M25531 Pain in right wrist: Secondary | ICD-10-CM | POA: Diagnosis not present

## 2023-01-07 DIAGNOSIS — M13831 Other specified arthritis, right wrist: Secondary | ICD-10-CM | POA: Diagnosis not present

## 2023-01-07 DIAGNOSIS — M19031 Primary osteoarthritis, right wrist: Secondary | ICD-10-CM | POA: Diagnosis not present

## 2023-01-15 DIAGNOSIS — M25641 Stiffness of right hand, not elsewhere classified: Secondary | ICD-10-CM | POA: Diagnosis not present

## 2023-01-21 DIAGNOSIS — M25641 Stiffness of right hand, not elsewhere classified: Secondary | ICD-10-CM | POA: Diagnosis not present

## 2023-01-29 DIAGNOSIS — M25641 Stiffness of right hand, not elsewhere classified: Secondary | ICD-10-CM | POA: Diagnosis not present

## 2023-02-05 DIAGNOSIS — M25641 Stiffness of right hand, not elsewhere classified: Secondary | ICD-10-CM | POA: Diagnosis not present

## 2023-02-11 DIAGNOSIS — M13831 Other specified arthritis, right wrist: Secondary | ICD-10-CM | POA: Diagnosis not present

## 2023-02-12 ENCOUNTER — Ambulatory Visit: Payer: Federal, State, Local not specified - PPO | Admitting: Family Medicine

## 2023-02-12 ENCOUNTER — Encounter: Payer: Self-pay | Admitting: Family Medicine

## 2023-02-12 VITALS — BP 126/84 | HR 56 | Temp 97.6°F | Ht 72.0 in | Wt 238.1 lb

## 2023-02-12 DIAGNOSIS — I10 Essential (primary) hypertension: Secondary | ICD-10-CM

## 2023-02-12 DIAGNOSIS — E66811 Obesity, class 1: Secondary | ICD-10-CM | POA: Diagnosis not present

## 2023-02-12 DIAGNOSIS — Z23 Encounter for immunization: Secondary | ICD-10-CM

## 2023-02-12 DIAGNOSIS — M25641 Stiffness of right hand, not elsewhere classified: Secondary | ICD-10-CM | POA: Diagnosis not present

## 2023-02-12 DIAGNOSIS — Z0001 Encounter for general adult medical examination with abnormal findings: Secondary | ICD-10-CM

## 2023-02-12 DIAGNOSIS — Z0000 Encounter for general adult medical examination without abnormal findings: Secondary | ICD-10-CM

## 2023-02-12 DIAGNOSIS — E782 Mixed hyperlipidemia: Secondary | ICD-10-CM

## 2023-02-12 DIAGNOSIS — Z6832 Body mass index (BMI) 32.0-32.9, adult: Secondary | ICD-10-CM

## 2023-02-12 NOTE — Assessment & Plan Note (Signed)
Discussed importance of healthy weight management Discussed diet and exercise

## 2023-02-12 NOTE — Progress Notes (Signed)
Complete physical exam  Patient: Bryan Cortez   DOB: 12-06-1970   52 y.o. Male  MRN: [id number]  Subjective:    Chief Complaint  Patient presents with   Annual Exam    Annual physical. Fasting if need labs.    Bryan Cortez is a 52 y.o. male who presents today for a complete physical exam. He reports consuming a general diet.  He generally feels well. He reports sleeping well. He does not have additional problems to discuss today.    Discussed the use of AI scribe software for clinical note transcription with the patient, who gave verbal consent to proceed.  History of Present Illness   The patient, with a history of hypertension managed with Lisinopril HCTZ 20-25, presents for a routine check-up. They have recently undergone two wrist surgeries, the first of which failed. The patient reports a scare over the weekend where the area over their surgical plate swelled up, but an x-ray confirmed that everything was fine. They have been doing more intense stretches as part of their recovery, which has caused some discomfort. The patient also mentions a significant amount of inactivity and weight gain due to their recovery, which has led to feelings of depression. They have been trying to counteract this by going to the gym four days a week, focusing on lower body exercises. The patient also mentions helping with a relief fund, which involved lifting cases of water. They have been managing this by wearing a brace.       Most recent fall risk assessment:    02/12/2023   10:10 AM  Fall Risk   Falls in the past year? 1  Number falls in past yr: 0  Injury with Fall? 1  Risk for fall due to : No Fall Risks  Follow up Falls evaluation completed     Most recent depression screenings:    02/12/2023   10:10 AM 07/12/2022    8:09 AM  PHQ 2/9 Scores  PHQ - 2 Score 0 0  PHQ- 9 Score 0 0        Patient Care Team: Erasmo Downer, MD as PCP - General (Family Medicine)    Outpatient Medications Prior to Visit  Medication Sig   lisinopril-hydrochlorothiazide (ZESTORETIC) 20-25 MG tablet Take 1 tablet by mouth daily.   [DISCONTINUED] hydrOXYzine (VISTARIL) 25 MG capsule Take 1 capsule (25 mg total) by mouth at bedtime as needed for anxiety. (Patient not taking: Reported on 02/12/2023)   No facility-administered medications prior to visit.    ROS        Objective:     BP 126/84 (BP Location: Left Arm, Patient Position: Sitting, Cuff Size: Large)   Pulse (!) 56   Temp 97.6 F (36.4 C) (Oral)   Ht 6' (1.829 m)   Wt 238 lb 1.6 oz (108 kg)   SpO2 100%   BMI 32.29 kg/m    Physical Exam Vitals reviewed.  Constitutional:      General: He is not in acute distress.    Appearance: Normal appearance. He is well-developed. He is not diaphoretic.  HENT:     Head: Normocephalic and atraumatic.     Right Ear: Tympanic membrane, ear canal and external ear normal.     Left Ear: Tympanic membrane, ear canal and external ear normal.     Nose: Nose normal.     Mouth/Throat:     Mouth: Mucous membranes are moist.     Pharynx: Oropharynx is clear.  No oropharyngeal exudate.  Eyes:     General: No scleral icterus.    Conjunctiva/sclera: Conjunctivae normal.     Pupils: Pupils are equal, round, and reactive to light.  Neck:     Thyroid: No thyromegaly.  Cardiovascular:     Rate and Rhythm: Normal rate and regular rhythm.     Heart sounds: Normal heart sounds. No murmur heard. Pulmonary:     Effort: Pulmonary effort is normal. No respiratory distress.     Breath sounds: Normal breath sounds. No wheezing or rales.  Abdominal:     General: There is no distension.     Palpations: Abdomen is soft.     Tenderness: There is no abdominal tenderness.  Musculoskeletal:        General: No deformity.     Cervical back: Neck supple.     Right lower leg: No edema.     Left lower leg: No edema.  Lymphadenopathy:     Cervical: No cervical adenopathy.  Skin:     General: Skin is warm and dry.     Findings: No rash.  Neurological:     Mental Status: He is alert and oriented to person, place, and time. Mental status is at baseline.     Gait: Gait normal.  Psychiatric:        Mood and Affect: Mood normal.        Behavior: Behavior normal.        Thought Content: Thought content normal.      No results found for any visits on 02/12/23.     Assessment & Plan:    Routine Health Maintenance and Physical Exam  Immunization History  Administered Date(s) Administered   Influenza,inj,Quad PF,6+ Mos 01/02/2018, 01/13/2019, 01/13/2020   Tdap 12/03/2013   Zoster Recombinant(Shingrix) 02/12/2023    Health Maintenance  Topic Date Due   COVID-19 Vaccine (1 - 2023-24 season) Never done   INFLUENZA VACCINE  07/29/2023 (Originally 11/29/2022)   Zoster Vaccines- Shingrix (2 of 2) 04/09/2023   DTaP/Tdap/Td (2 - Td or Tdap) 12/04/2023   Colonoscopy  01/23/2024   Hepatitis C Screening  Completed   HIV Screening  Completed   HPV VACCINES  Aged Out    Discussed health benefits of physical activity, and encouraged him to engage in regular exercise appropriate for his age and condition.  Problem List Items Addressed This Visit       Cardiovascular and Mediastinum   Essential hypertension    Blood pressure slightly elevated at visit, possibly due to recent weight gain and decreased activity due to wrist surgeries. Improved at end of visit.  Currently on Lisinopril HCTZ 20/25mg  daily. -Continue Lisinopril HCTZ 20/25mg  daily. -Recheck blood pressure in 6 months.      Relevant Orders   Comprehensive metabolic panel   Lipid panel     Other   Obesity    Discussed importance of healthy weight management Discussed diet and exercise       Relevant Orders   Comprehensive metabolic panel   Lipid panel   RESOLVED: Encounter for annual physical exam - Primary   Relevant Orders   Comprehensive metabolic panel   Lipid panel   Other Visit Diagnoses      Moderate mixed hyperlipidemia not requiring statin therapy       Relevant Orders   Comprehensive metabolic panel   Lipid panel   Immunization due       Relevant Orders   Varicella-zoster vaccine IM (Completed)   Need for shingles  vaccine       Relevant Orders   Varicella-zoster vaccine IM (Completed)           Post-operative wrist Recent second surgery after first one failed. Noted swelling over plate area after new, more intense stretches. X-ray confirmed no issues. Currently working on improving range of motion and muscle strength. -Continue current rehabilitation exercises and stretches. -Continue monitoring for any changes or concerns.  General Health Maintenance -Administer first dose of Shingles vaccine today. -Complete routine labs today. -Schedule follow-up appointment in 6 months.       Return in about 6 months (around 08/13/2023) for chronic disease f/u, shingrix #2.     Shirlee Latch, MD

## 2023-02-12 NOTE — Assessment & Plan Note (Signed)
Blood pressure slightly elevated at visit, possibly due to recent weight gain and decreased activity due to wrist surgeries. Improved at end of visit.  Currently on Lisinopril HCTZ 20/25mg  daily. -Continue Lisinopril HCTZ 20/25mg  daily. -Recheck blood pressure in 6 months.

## 2023-02-13 LAB — COMPREHENSIVE METABOLIC PANEL
ALT: 35 [IU]/L (ref 0–44)
AST: 25 [IU]/L (ref 0–40)
Albumin: 4.6 g/dL (ref 3.8–4.9)
Alkaline Phosphatase: 78 [IU]/L (ref 44–121)
BUN/Creatinine Ratio: 14 (ref 9–20)
BUN: 16 mg/dL (ref 6–24)
Bilirubin Total: 0.7 mg/dL (ref 0.0–1.2)
CO2: 24 mmol/L (ref 20–29)
Calcium: 9.7 mg/dL (ref 8.7–10.2)
Chloride: 98 mmol/L (ref 96–106)
Creatinine, Ser: 1.12 mg/dL (ref 0.76–1.27)
Globulin, Total: 2.6 g/dL (ref 1.5–4.5)
Glucose: 91 mg/dL (ref 70–99)
Potassium: 4.2 mmol/L (ref 3.5–5.2)
Sodium: 137 mmol/L (ref 134–144)
Total Protein: 7.2 g/dL (ref 6.0–8.5)
eGFR: 78 mL/min/{1.73_m2} (ref >59)

## 2023-02-13 LAB — LIPID PANEL
Chol/HDL Ratio: 4.1 {ratio} (ref 0.0–5.0)
Cholesterol, Total: 207 mg/dL — ABNORMAL HIGH (ref 100–199)
HDL: 50 mg/dL (ref >39)
LDL Chol Calc (NIH): 138 mg/dL — ABNORMAL HIGH (ref 0–99)
Triglycerides: 103 mg/dL (ref 0–149)
VLDL Cholesterol Cal: 19 mg/dL (ref 5–40)

## 2023-02-19 DIAGNOSIS — M25641 Stiffness of right hand, not elsewhere classified: Secondary | ICD-10-CM | POA: Diagnosis not present

## 2023-02-26 DIAGNOSIS — M25641 Stiffness of right hand, not elsewhere classified: Secondary | ICD-10-CM | POA: Diagnosis not present

## 2023-02-27 ENCOUNTER — Encounter: Payer: Federal, State, Local not specified - PPO | Admitting: Family Medicine

## 2023-03-05 DIAGNOSIS — M25641 Stiffness of right hand, not elsewhere classified: Secondary | ICD-10-CM | POA: Diagnosis not present

## 2023-03-12 DIAGNOSIS — M25641 Stiffness of right hand, not elsewhere classified: Secondary | ICD-10-CM | POA: Diagnosis not present

## 2023-03-19 DIAGNOSIS — M25641 Stiffness of right hand, not elsewhere classified: Secondary | ICD-10-CM | POA: Diagnosis not present

## 2023-03-21 DIAGNOSIS — M13831 Other specified arthritis, right wrist: Secondary | ICD-10-CM | POA: Diagnosis not present

## 2023-03-26 DIAGNOSIS — M25641 Stiffness of right hand, not elsewhere classified: Secondary | ICD-10-CM | POA: Diagnosis not present

## 2023-05-06 DIAGNOSIS — L578 Other skin changes due to chronic exposure to nonionizing radiation: Secondary | ICD-10-CM | POA: Diagnosis not present

## 2023-05-06 DIAGNOSIS — Z86018 Personal history of other benign neoplasm: Secondary | ICD-10-CM | POA: Diagnosis not present

## 2023-05-06 DIAGNOSIS — Z872 Personal history of diseases of the skin and subcutaneous tissue: Secondary | ICD-10-CM | POA: Diagnosis not present

## 2023-05-06 DIAGNOSIS — Z85828 Personal history of other malignant neoplasm of skin: Secondary | ICD-10-CM | POA: Diagnosis not present

## 2023-05-31 ENCOUNTER — Encounter: Payer: Self-pay | Admitting: Family Medicine

## 2023-06-10 ENCOUNTER — Ambulatory Visit: Payer: Federal, State, Local not specified - PPO | Admitting: Family Medicine

## 2023-06-10 ENCOUNTER — Encounter: Payer: Self-pay | Admitting: Family Medicine

## 2023-06-10 DIAGNOSIS — Z23 Encounter for immunization: Secondary | ICD-10-CM | POA: Diagnosis not present

## 2023-06-10 NOTE — Progress Notes (Signed)
 Shingrix  given without complication

## 2023-06-10 NOTE — Progress Notes (Signed)
 Patient here for Shingrix  vaccination only.  I did not examine the patient.  I did review his medical history, medications, and allergies and vaccine consent form.  CMA gave vaccination. Patient tolerated well.  Mazie Speed, MD, MPH [facility] 06/10/2023 9:12 AM

## 2023-08-02 ENCOUNTER — Other Ambulatory Visit: Payer: Self-pay | Admitting: Family Medicine

## 2023-08-02 DIAGNOSIS — I10 Essential (primary) hypertension: Secondary | ICD-10-CM

## 2023-08-02 NOTE — Telephone Encounter (Signed)
 Requested Prescriptions  Pending Prescriptions Disp Refills   lisinopril-hydrochlorothiazide (ZESTORETIC) 20-25 MG tablet [Pharmacy Med Name: LISINOPRIL-HCTZ 20/25MG  TABLETS] 90 tablet 0    Sig: TAKE 1 TABLET BY MOUTH DAILY     Cardiovascular:  ACEI + Diuretic Combos Failed - 08/02/2023  3:46 PM      Failed - Valid encounter within last 6 months    Recent Outpatient Visits           1 month ago Need for shingles vaccine   [facility] [place], Marzella Schlein, MD       Future Appointments             In 2 weeks Bacigalupo, Marzella Schlein, MD [facility]            Passed - Na in normal range and within 180 days    Sodium  Date Value Ref Range Status  02/12/2023 137 134 - 144 mmol/L Final         Passed - K in normal range and within 180 days    Potassium  Date Value Ref Range Status  02/12/2023 4.2 3.5 - 5.2 mmol/L Final         Passed - Cr in normal range and within 180 days    Creatinine, Ser  Date Value Ref Range Status  02/12/2023 1.12 0.76 - 1.27 mg/dL Final         Passed - eGFR is 30 or above and within 180 days    GFR calc Af Amer  Date Value Ref Range Status  01/14/2020 102 >59 mL/min/1.73 Final    Comment:    **[facility] currently reports eGFR in compliance with the current**   recommendations of the [employer]. [facility] will   update reporting as new guidelines are published from the NKF-ASN   Task force.    GFR calc non Af Amer  Date Value Ref Range Status  01/14/2020 88 >59 mL/min/1.73 Final   eGFR  Date Value Ref Range Status  02/12/2023 78 >59 mL/min/1.73 Final         Passed - Patient is not pregnant      Passed - Last BP in normal range    BP Readings from Last 1 Encounters:  02/12/23 126/84

## 2023-08-06 ENCOUNTER — Other Ambulatory Visit: Payer: Self-pay | Admitting: Family Medicine

## 2023-08-06 DIAGNOSIS — I10 Essential (primary) hypertension: Secondary | ICD-10-CM

## 2023-08-22 ENCOUNTER — Encounter: Payer: Self-pay | Admitting: Family Medicine

## 2023-08-22 ENCOUNTER — Ambulatory Visit: Payer: Self-pay | Admitting: Family Medicine

## 2023-08-22 VITALS — BP 133/85 | HR 65 | Ht 72.0 in | Wt 231.7 lb

## 2023-08-22 DIAGNOSIS — I10 Essential (primary) hypertension: Secondary | ICD-10-CM

## 2023-08-22 MED ORDER — LISINOPRIL-HYDROCHLOROTHIAZIDE 20-25 MG PO TABS: 1.0000 | ORAL_TABLET | Freq: Every day | ORAL | 3 refills | Status: AC

## 2023-08-22 NOTE — Assessment & Plan Note (Signed)
 Hypertension is well-controlled with lisinopril -HCTZ 20-25 mg daily. Blood pressure readings are within normal range with no reported side effects, dizziness, or lightheadedness. - Continue lisinopril -HCTZ 20-25 mg daily. - Order kidney function and electrolyte tests. - Schedule physical exam in six months.

## 2023-08-22 NOTE — Progress Notes (Signed)
 Established patient visit   Patient: Bryan Cortez   DOB: 03-01-1971   53 y.o. Male  MRN: [id number] Visit Date: 08/22/2023  Today's healthcare provider: Aden Agreste, MD   Chief Complaint  Patient presents with   Medical Management of Chronic Issues   Hypertension    Patient does not monitor at home and taking edications as prescribed with no symptoms to report   Subjective    Hypertension   HPI     Hypertension    Additional comments: Patient does not monitor at home and taking edications as prescribed with no symptoms to report      Last edited by Pasty Bongo, CMA on 08/22/2023  8:10 AM.       Discussed the use of AI scribe software for clinical note transcription with the patient, who gave verbal consent to proceed.  History of Present Illness   A 53 year old patient with a history of hypertension presents for a routine follow-up visit. The patient's blood pressure is well controlled on Lisinopril -HCTZ 20-25mg  daily. The patient reports no adverse effects from the medication. The patient has completed the shingles vaccination series and reports no significant side effects from the vaccine. The patient denies any new symptoms or health concerns.         Medications: Outpatient Medications Prior to Visit  Medication Sig   [DISCONTINUED] lisinopril -hydrochlorothiazide  (ZESTORETIC ) 20-25 MG tablet TAKE 1 TABLET BY MOUTH DAILY   No facility-administered medications prior to visit.    Review of Systems     Objective    BP 133/85 (BP Location: Left Arm, Patient Position: Sitting, Cuff Size: Large)   Pulse 65   Ht 6' (1.829 m)   Wt 231 lb 11.2 oz (105.1 kg)   SpO2 100%   BMI 31.42 kg/m    Physical Exam Vitals reviewed.  Constitutional:      General: He is not in acute distress.    Appearance: Normal appearance. He is not diaphoretic.  HENT:     Head: Normocephalic and atraumatic.  Eyes:     General: No scleral icterus.     Conjunctiva/sclera: Conjunctivae normal.  Cardiovascular:     Rate and Rhythm: Normal rate and regular rhythm.     Heart sounds: Normal heart sounds. No murmur heard. Pulmonary:     Effort: Pulmonary effort is normal. No respiratory distress.     Breath sounds: Normal breath sounds. No wheezing or rhonchi.  Musculoskeletal:     Cervical back: Neck supple.     Right lower leg: No edema.     Left lower leg: No edema.  Lymphadenopathy:     Cervical: No cervical adenopathy.  Skin:    General: Skin is warm and dry.     Findings: No rash.  Neurological:     Mental Status: He is alert and oriented to person, place, and time. Mental status is at baseline.  Psychiatric:        Mood and Affect: Mood normal.        Behavior: Behavior normal.      No results found for any visits on 08/22/23.  Assessment & Plan     Problem List Items Addressed This Visit       Cardiovascular and Mediastinum   Essential hypertension - Primary   Hypertension is well-controlled with lisinopril -HCTZ 20-25 mg daily. Blood pressure readings are within normal range with no reported side effects, dizziness, or lightheadedness. - Continue lisinopril -HCTZ 20-25 mg daily. - Order kidney function  and electrolyte tests. - Schedule physical exam in six months.      Relevant Medications   lisinopril -hydrochlorothiazide  (ZESTORETIC ) 20-25 MG tablet   Other Relevant Orders   Basic Metabolic Panel (BMET)        General Health Maintenance He has completed the shingles vaccination series without adverse reactions. - Continue routine health screenings as per guidelines.        Return in about 6 months (around 02/21/2024) for CPE.       Aden Agreste, MD  [facility] [phone] (phone) [phone] (fax)  [facility]

## 2023-08-23 DIAGNOSIS — I10 Essential (primary) hypertension: Secondary | ICD-10-CM | POA: Diagnosis not present

## 2023-08-24 LAB — BASIC METABOLIC PANEL WITH GFR
BUN/Creatinine Ratio: 15 (ref 9–20)
BUN: 18 mg/dL (ref 6–24)
CO2: 23 mmol/L (ref 20–29)
Calcium: 9.4 mg/dL (ref 8.7–10.2)
Chloride: 101 mmol/L (ref 96–106)
Creatinine, Ser: 1.21 mg/dL (ref 0.76–1.27)
Glucose: 102 mg/dL — ABNORMAL HIGH (ref 70–99)
Potassium: 3.6 mmol/L (ref 3.5–5.2)
Sodium: 141 mmol/L (ref 134–144)
eGFR: 71 mL/min/{1.73_m2} (ref >59)

## 2023-08-26 ENCOUNTER — Encounter: Payer: Self-pay | Admitting: Family Medicine

## 2024-02-14 ENCOUNTER — Telehealth: Payer: Self-pay

## 2024-02-14 DIAGNOSIS — Z1211 Encounter for screening for malignant neoplasm of colon: Secondary | ICD-10-CM

## 2024-02-14 NOTE — Telephone Encounter (Signed)
 Copied from CRM [id number]. Topic: Referral - Request for Referral >> Feb 14, 2024 10:10 AM Everette C wrote: Did the patient discuss referral with their provider in the last year? No (If No - schedule appointment) (If Yes - send message)  Appointment offered? No  Type of order/referral and detailed reason for visit: Endoscopy/Gastro. The patient would like to have another colonoscopy   Preference of office, provider, location: Dr. Rogelia Copping  If referral order, have you been seen by this specialty before? Yes (If Yes, this issue or another issue? When? Where? 01/23/19  Can we respond through MyChart? No

## 2024-02-14 NOTE — Telephone Encounter (Signed)
 Referral placed.

## 2024-02-19 ENCOUNTER — Ambulatory Visit: Admitting: Family Medicine

## 2024-02-19 ENCOUNTER — Ambulatory Visit
Admission: RE | Admit: 2024-02-19 | Discharge: 2024-02-19 | Disposition: A | Discharge status: Home / Self Care | Source: Ambulatory Visit | Attending: Family Medicine | Admitting: Family Medicine

## 2024-02-19 ENCOUNTER — Encounter: Payer: Self-pay | Admitting: Family Medicine

## 2024-02-19 ENCOUNTER — Ambulatory Visit
Admission: RE | Admit: 2024-02-19 | Discharge: 2024-02-19 | Disposition: A | Discharge status: Home / Self Care | Attending: Family Medicine | Admitting: Family Medicine

## 2024-02-19 VITALS — BP 122/82 | HR 63 | Temp 98.5°F | Ht 72.0 in | Wt 232.7 lb

## 2024-02-19 DIAGNOSIS — Z87442 Personal history of urinary calculi: Secondary | ICD-10-CM

## 2024-02-19 DIAGNOSIS — R10A2 Flank pain, left side: Secondary | ICD-10-CM | POA: Insufficient documentation

## 2024-02-19 DIAGNOSIS — N200 Calculus of kidney: Secondary | ICD-10-CM | POA: Diagnosis not present

## 2024-02-19 LAB — POCT URINALYSIS DIPSTICK
Bilirubin, UA: NEGATIVE
Blood, UA: NEGATIVE
Glucose, UA: NEGATIVE
Ketones, UA: NEGATIVE
Leukocytes, UA: NEGATIVE
Nitrite, UA: NEGATIVE
Protein, UA: NEGATIVE
Spec Grav, UA: 1.01 (ref 1.010–1.025)
Urobilinogen, UA: 0.2 U/dL
pH, UA: 7 (ref 5.0–8.0)

## 2024-02-19 MED ORDER — IBUPROFEN 600 MG PO TABS
600.0000 mg | ORAL_TABLET | Freq: Three times a day (TID) | ORAL | 0 refills | Status: AC | PRN
Start: 2024-02-19 — End: ?

## 2024-02-19 MED ORDER — TAMSULOSIN HCL 0.4 MG PO CAPS: 0.4000 mg | ORAL_CAPSULE | Freq: Every day | ORAL | 3 refills | Status: DC

## 2024-02-19 NOTE — Progress Notes (Signed)
 Established patient visit   Patient: Bryan Cortez   DOB: [birth year]   53 y.o. Male  MRN: [id number] Visit Date: 02/19/2024  Today's healthcare provider: LAURAINE LOISE BUOY, DO   Chief Complaint  Patient presents with   Acute Visit    Patient is here reports that he has some left lower back pain for about 3-4 weeks below the rib cage.  When the pain started it does get intense but not all the time.  States that he has been drinking cranberry juice not sure if it really helped, because he has history of kidney stones.  His dizziness started a few days ago, he was just sitting on the couch with his wife and he all of a sudden started feelng loopy.   Subjective    HPI Bryan Cortez is a 53 year old male with a history of kidney stones who presents with left-sided back pain.  He has been experiencing left-sided back pain under the rib for the past three to four weeks. The pain is dull and persistent, sometimes worsening, and is not associated with any recent trauma. Unlike previous kidney stone episodes, the pain does not radiate and is more noticeable when bending or pushing his shoulders back.  He has a history of kidney stones, with episodes occurring every two to three years. He has not had any recent kidney stones and has been increasing his water  intake. He recalls an x-ray showing two small stones in the past, which were not symptomatic when passed. He is not currently taking any medication for the pain, preferring to avoid medication unless necessary.  He experienced an episode of dizziness a few days ago while sitting on the couch, which was almost to the point of nausea but resolved after resting. He mentions feeling more lightheaded post-COVID, especially when standing up quickly, but denies any room-spinning sensation. He doesn't remember the last episode prior to the one a few days ago.   He has a past allergic reaction to naproxen, which caused hives when combined with sun  exposure, but he tolerates ibuprofen  well. He is concerned about the potential impact of blood pressure medications on his kidneys, as he is monitored every six months.      Medications: Outpatient Medications Prior to Visit  Medication Sig   lisinopril -hydrochlorothiazide  (ZESTORETIC ) 20-25 MG tablet Take 1 tablet by mouth daily.   No facility-administered medications prior to visit.    Review of Systems  Constitutional:  Negative for chills and fever.  Respiratory: Negative.  Negative for cough, shortness of breath and wheezing.   Cardiovascular:  Negative for chest pain, palpitations and leg swelling.  Gastrointestinal:  Positive for nausea (r/t lightheadedness episode). Negative for abdominal pain.  Genitourinary:  Positive for flank pain (left side). Negative for decreased urine volume, dysuria, frequency, hematuria and urgency.  Neurological:  Positive for light-headedness. Negative for dizziness, weakness and headaches.        Objective    BP 122/82 (BP Location: Left Arm, Patient Position: Sitting, Cuff Size: Normal)   Pulse 63   Temp 98.5 F (36.9 C) (Oral)   Ht 6' (1.829 m)   Wt 232 lb 11.2 oz (105.6 kg)   SpO2 97%   BMI 31.56 kg/m     Physical Exam Vitals reviewed.  Constitutional:      General: He is not in acute distress.    Appearance: Normal appearance. He is not diaphoretic.  HENT:     Head: Normocephalic  and atraumatic.  Eyes:     General: No scleral icterus.    Conjunctiva/sclera: Conjunctivae normal.  Cardiovascular:     Rate and Rhythm: Normal rate and regular rhythm.     Pulses: Normal pulses.     Heart sounds: Normal heart sounds. No murmur heard. Pulmonary:     Effort: Pulmonary effort is normal. No respiratory distress.     Breath sounds: Normal breath sounds. No wheezing or rhonchi.  Musculoskeletal:     Cervical back: Neck supple.       Back:     Right lower leg: No edema.     Left lower leg: No edema.     Comments: Pain in  region noted, worsened with deep palpation but not light-to-moderate palpation.  Lymphadenopathy:     Cervical: No cervical adenopathy.  Skin:    General: Skin is warm and dry.     Findings: No rash.  Neurological:     Mental Status: He is alert and oriented to person, place, and time. Mental status is at baseline.  Psychiatric:        Mood and Affect: Mood normal.        Behavior: Behavior normal.      No results found for any visits on 02/19/24.  Assessment & Plan    Acute left flank pain -     POCT urinalysis dipstick -     Tamsulosin  HCl; Take 1 capsule (0.4 mg total) by mouth daily.  Dispense: 28 capsule; Refill: 3 -     DG Abd 2 Views; Future -     Ibuprofen ; Take 1 tablet (600 mg total) by mouth every 8 (eight) hours as needed.  Dispense: 30 tablet; Refill: 0  History of nephrolithiasis -     DG Abd 2 Views; Future    Acute left flank pain; Nephrolithiasis  Chronic left flank pain for 3-4 weeks, likely due to recurrent nephrolithiasis, less likely musculoskeletal in origin. No urinary symptoms or systemic signs. Increased water  intake for prevention. No recent dietary changes affecting calcium intake.  - POC urinalysis negative. - Order x-ray to evaluate underlying causes. - Consider renal ultrasound if x-ray inconclusive and symptoms persist. - Prescribe high-dose ibuprofen  for pain management as needed. - Prescribe Flomax  to facilitate potential kidney stone passage.  Lightheadedness Acute single episode of lightheadedness, description inconsistent with vertigo. Suspect episode was vasovagal response given lack of recent recurrence and extremely sporadic nature of episodes. - patient advised to monitor episodes and write down when they occur, his experience and the circumstances surrounding each episode to better evaluate likely cause and presence of any patterns.    Return if symptoms worsen or fail to improve.      I discussed the assessment and treatment plan  with the patient  The patient was provided an opportunity to ask questions and all were answered. The patient agreed with the plan and demonstrated an understanding of the instructions.   The patient was advised to call back or seek an in-person evaluation if the symptoms worsen or if the condition fails to improve as anticipated.    LAURAINE LOISE BUOY, DO  [facility] [phone] (phone) [phone] (fax)  [facility]

## 2024-02-21 ENCOUNTER — Telehealth: Payer: Self-pay

## 2024-02-21 NOTE — Telephone Encounter (Signed)
 Copied from CRM [id number]. Topic: Clinical - Lab/Test Results >> Feb 21, 2024  2:59 PM Charlet HERO wrote: Reason for CRM: Patient is calling to get results for his labs he would like to have someone go over them with him as well as the xrays.

## 2024-02-24 ENCOUNTER — Ambulatory Visit: Admitting: Family Medicine

## 2024-02-24 ENCOUNTER — Telehealth: Payer: Self-pay

## 2024-02-24 ENCOUNTER — Other Ambulatory Visit: Payer: Self-pay

## 2024-02-24 ENCOUNTER — Encounter: Payer: Self-pay | Admitting: Family Medicine

## 2024-02-24 VITALS — BP 129/85 | HR 60 | Ht 72.0 in | Wt 231.1 lb

## 2024-02-24 DIAGNOSIS — N401 Enlarged prostate with lower urinary tract symptoms: Secondary | ICD-10-CM | POA: Insufficient documentation

## 2024-02-24 DIAGNOSIS — M546 Pain in thoracic spine: Secondary | ICD-10-CM | POA: Diagnosis not present

## 2024-02-24 DIAGNOSIS — Z125 Encounter for screening for malignant neoplasm of prostate: Secondary | ICD-10-CM

## 2024-02-24 DIAGNOSIS — Z6831 Body mass index (BMI) 31.0-31.9, adult: Secondary | ICD-10-CM

## 2024-02-24 DIAGNOSIS — I10 Essential (primary) hypertension: Secondary | ICD-10-CM | POA: Diagnosis not present

## 2024-02-24 DIAGNOSIS — G8929 Other chronic pain: Secondary | ICD-10-CM

## 2024-02-24 DIAGNOSIS — Z0000 Encounter for general adult medical examination without abnormal findings: Secondary | ICD-10-CM | POA: Diagnosis not present

## 2024-02-24 DIAGNOSIS — N4289 Other specified disorders of prostate: Secondary | ICD-10-CM

## 2024-02-24 DIAGNOSIS — M25512 Pain in left shoulder: Secondary | ICD-10-CM

## 2024-02-24 DIAGNOSIS — Z860100 Personal history of colon polyps, unspecified: Secondary | ICD-10-CM

## 2024-02-24 DIAGNOSIS — Z23 Encounter for immunization: Secondary | ICD-10-CM

## 2024-02-24 DIAGNOSIS — R739 Hyperglycemia, unspecified: Secondary | ICD-10-CM

## 2024-02-24 DIAGNOSIS — N200 Calculus of kidney: Secondary | ICD-10-CM

## 2024-02-24 DIAGNOSIS — E66811 Obesity, class 1: Secondary | ICD-10-CM | POA: Diagnosis not present

## 2024-02-24 DIAGNOSIS — R3912 Poor urinary stream: Secondary | ICD-10-CM

## 2024-02-24 DIAGNOSIS — Z1211 Encounter for screening for malignant neoplasm of colon: Secondary | ICD-10-CM

## 2024-02-24 MED ORDER — NA SULFATE-K SULFATE-MG SULF 17.5-3.13-1.6 GM/177ML PO SOLN
1.0000 | Freq: Once | ORAL | 0 refills | Status: AC
Start: 2024-02-24 — End: 2024-02-24

## 2024-02-24 NOTE — Telephone Encounter (Signed)
 Pt seen in office today. Questions answered during visit

## 2024-02-24 NOTE — Progress Notes (Signed)
 Complete physical exam   Patient: Bryan Cortez   DOB: 1970/08/31   53 y.o. Male  MRN: [id number] Visit Date: 02/24/2024  Today's healthcare provider: Jon Eva, MD   Chief Complaint  Patient presents with   Annual Exam    Last completed 02/12/23 Diet -  unhealthy Exercise - four times a week Feeling - well Sleeping - well Concerns -  continued pain in back and discuss imaging   Subjective    Bryan Cortez is a 53 y.o. male who presents today for a complete physical exam.    Discussed the use of AI scribe software for clinical note transcription with the patient, who gave verbal consent to proceed.  History of Present Illness   Bryan Cortez is a 53 year old male who presents for a physical exam and follow-up on flank pain and kidney stone evaluation.  He experiences dull flank pain, particularly noticeable when bending. An x-ray previously revealed a 2 mm kidney stone still located in the kidney. He has not started Flomax , awaiting further information. He has experienced kidney stones in the past, with episodes requiring hospital visits due to severe pain and vomiting.  Back pain has been present for about a month, associated with bending and described as dull. He describes a 'popping' sensation in his ribs, which is unusual and painful.  Shoulder pain has been present for several months, sometimes limiting his ability to lift his arm. He describes a sensation of something 'popping' in his shoulder, associated with previous overuse. Exercises have been attempted to alleviate the pain, but some movements remain painful.  He is currently taking lisinopril  HCTZ for blood pressure management, which is effective. He has not started any new medications for his current symptoms.  Previous imaging has noted prostate calcifications since at least 2017. He experiences occasional urinary symptoms, such as a weaker stream in the morning, but these are not consistent.  He is  due for a colonoscopy, having had adenomatous polyps removed in the past, which were benign.        Last depression screening scores    02/19/2024    8:34 AM 02/12/2023   10:10 AM 07/12/2022    8:09 AM  PHQ 2/9 Scores  PHQ - 2 Score 0 0 0  PHQ- 9 Score 0 0 0   Last fall risk screening    02/19/2024    8:34 AM  Fall Risk   Falls in the past year? 0  Number falls in past yr: 0  Injury with Fall? 0  Risk for fall due to : No Fall Risks        Medications: Outpatient Medications Prior to Visit  Medication Sig   lisinopril -hydrochlorothiazide  (ZESTORETIC ) 20-25 MG tablet Take 1 tablet by mouth daily.   ibuprofen  (ADVIL ) 600 MG tablet Take 1 tablet (600 mg total) by mouth every 8 (eight) hours as needed. (Patient not taking: Reported on 02/24/2024)   [DISCONTINUED] tamsulosin  (FLOMAX ) 0.4 MG CAPS capsule Take 1 capsule (0.4 mg total) by mouth daily. (Patient not taking: Reported on 02/24/2024)   No facility-administered medications prior to visit.    Review of Systems    Objective    BP 129/85 (BP Location: Left Arm, Patient Position: Sitting, Cuff Size: Large)   Pulse 60   Ht 6' (1.829 m)   Wt 231 lb 1.6 oz (104.8 kg)   SpO2 97%   BMI 31.34 kg/m    Physical Exam Vitals reviewed.  Constitutional:  General: He is not in acute distress.    Appearance: Normal appearance. He is well-developed. He is not diaphoretic.  HENT:     Head: Normocephalic and atraumatic.     Right Ear: Tympanic membrane, ear canal and external ear normal.     Left Ear: Tympanic membrane, ear canal and external ear normal.     Nose: Nose normal.     Mouth/Throat:     Mouth: Mucous membranes are moist.     Pharynx: Oropharynx is clear. No oropharyngeal exudate.  Eyes:     General: No scleral icterus.    Conjunctiva/sclera: Conjunctivae normal.     Pupils: Pupils are equal, round, and reactive to light.  Neck:     Thyroid: No thyromegaly.  Cardiovascular:     Rate and Rhythm:  Normal rate and regular rhythm.     Heart sounds: Normal heart sounds. No murmur heard. Pulmonary:     Effort: Pulmonary effort is normal. No respiratory distress.     Breath sounds: Normal breath sounds. No wheezing or rales.  Abdominal:     General: There is no distension.     Palpations: Abdomen is soft.     Tenderness: There is no abdominal tenderness.  Musculoskeletal:        General: No deformity.     Cervical back: Neck supple.     Right lower leg: No edema.     Left lower leg: No edema.     Comments: L shoulder: Normal ROM, strength intact. Mild TTP over coracoid process. Negative Obriens  Lymphadenopathy:     Cervical: No cervical adenopathy.  Skin:    General: Skin is warm and dry.     Findings: No rash.  Neurological:     Mental Status: He is alert and oriented to person, place, and time. Mental status is at baseline.     Gait: Gait normal.  Psychiatric:        Mood and Affect: Mood normal.        Behavior: Behavior normal.        Thought Content: Thought content normal.      No results found for any visits on 02/24/24.  Assessment & Plan    Routine Health Maintenance and Physical Exam  Exercise Activities and Dietary recommendations  Goals   None     Immunization History  Administered Date(s) Administered   Influenza,inj,Quad PF,6+ Mos 01/02/2018, 01/13/2019, 01/13/2020   Td 02/24/2024   Tdap 12/03/2013   Zoster Recombinant(Shingrix ) 02/12/2023, 06/10/2023    Health Maintenance  Topic Date Due   Hepatitis B Vaccines 19-59 Average Risk (1 of 3 - 19+ 3-dose series) Never done   Pneumococcal Vaccine: 50+ Years (1 of 1 - PCV) Never done   COVID-19 Vaccine (1 - 2025-26 season) Never done   Colonoscopy  01/23/2024   Influenza Vaccine  07/28/2024 (Originally 11/29/2023)   DTaP/Tdap/Td (3 - Td or Tdap) 02/23/2034   Hepatitis C Screening  Completed   HIV Screening  Completed   Zoster Vaccines- Shingrix   Completed   HPV VACCINES  Aged Out   Meningococcal B  Vaccine  Aged Out    Discussed health benefits of physical activity, and encouraged him to engage in regular exercise appropriate for his age and condition.  Problem List Items Addressed This Visit       Cardiovascular and Mediastinum   Essential hypertension   Blood pressure is well-controlled with current lisinopril  HCTZ regimen. - Continue lisinopril  HCTZ - Schedule six-month follow-up for blood  pressure monitoring      Relevant Orders   Comprehensive metabolic panel with GFR   Lipid panel   Hemoglobin A1c     Genitourinary   Benign prostatic hyperplasia with weak urinary stream   Symptoms include weaker urinary stream and prolonged urination in the morning. Prostate calcifications noted but not causing significant symptoms. - Order PSA test for prostate cancer screening - Consider Flomax  if symptoms worsen or become more frequent        Other   Obesity   Relevant Orders   Comprehensive metabolic panel with GFR   Lipid panel   Hemoglobin A1c   Other Visit Diagnoses       Encounter for annual physical exam    -  Primary   Relevant Orders   Comprehensive metabolic panel with GFR   Lipid panel   Hemoglobin A1c     Chronic left shoulder pain         Acute left-sided thoracic back pain         Hyperglycemia       Relevant Orders   Hemoglobin A1c     Calcification of prostate         Prostate cancer screening       Relevant Orders   PSA Total (Reflex To Free)     Renal stone         Immunization due       Relevant Orders   Td : Tetanus/diphtheria >7yo Preservative  free (Completed)           Adult Wellness Visit Routine adult wellness visit with focus on general health maintenance. - Administer tetanus vaccine - Perform lab work including cholesterol, kidney and liver function, and A1c - Discuss colonoscopy referral with [facility] - Order PSA test for prostate cancer screening  Left rotator cuff tendinitis and shoulder pain Chronic shoulder pain  with possible rotator cuff tendinitis, exacerbated by certain movements. No evidence of tear or significant structural damage. - Prescribe ibuprofen  600 mg for pain and inflammation - Provide shoulder stretches and exercises  Thoracic back pain with degenerative changes Thoracic back pain likely related to degenerative changes, possibly connected to shoulder issues. Pain is dull and persistent. - Prescribe ibuprofen  600 mg for pain and inflammation  Nephrolithiasis (renal calculus) Incidental finding of a 2 mm renal calculus in the kidney, not causing current symptoms. Previous larger stones caused significant pain when obstructing. - Hold off on Flomax  unless pain becomes crampy and stone-like  Colorectal adenomas History of colorectal adenomas. Due for repeat colonoscopy. - Discuss colonoscopy referral with [facility]        Return in about 6 months (around 08/24/2024) for chronic disease f/u.     Jon Eva, MD  [facility] [phone] (phone) [phone] (fax)  [facility]

## 2024-02-24 NOTE — Assessment & Plan Note (Signed)
 Symptoms include weaker urinary stream and prolonged urination in the morning. Prostate calcifications noted but not causing significant symptoms. - Order PSA test for prostate cancer screening - Consider Flomax  if symptoms worsen or become more frequent

## 2024-02-24 NOTE — Telephone Encounter (Signed)
 Gastroenterology Pre-Procedure Review  Request Date: 05/11/24 Requesting Physician: Dr. Melany  PATIENT REVIEW QUESTIONS: The patient responded to the following health history questions as indicated:    1. Are you having any GI issues? no 2. Do you have a personal history of Polyps? yes (last colonoscopy performed by Dr. Jinny 01/27/19 recommended repeat in 5 years) 3. Do you have a family history of Colon Cancer or Polyps? no 4. Diabetes Mellitus? no 5. Joint replacements in the past 12 months?no 6. Major health problems in the past 3 months?no 7. Any artificial heart valves, MVP, or defibrillator?no    MEDICATIONS & ALLERGIES:    Patient reports the following regarding taking any anticoagulation/antiplatelet therapy:   Plavix, Coumadin, Eliquis, Xarelto, Lovenox, Pradaxa, Brilinta, or Effient? no Aspirin? no  Patient confirms/reports the following medications:  Current Outpatient Medications  Medication Sig Dispense Refill   ibuprofen  (ADVIL ) 600 MG tablet Take 1 tablet (600 mg total) by mouth every 8 (eight) hours as needed. (Patient not taking: Reported on 02/24/2024) 30 tablet 0   lisinopril -hydrochlorothiazide  (ZESTORETIC ) 20-25 MG tablet Take 1 tablet by mouth daily. 90 tablet 3   No current facility-administered medications for this visit.    Patient confirms/reports the following allergies:  Allergies  Allergen Reactions   Naproxen Hives    No orders of the defined types were placed in this encounter.   AUTHORIZATION INFORMATION Primary Insurance: 1D#: Group #:  Secondary Insurance: 1D#: Group #:  SCHEDULE INFORMATION: Date: 05/11/24 Time: Location: [facility]

## 2024-02-24 NOTE — Assessment & Plan Note (Signed)
 Blood pressure is well-controlled with current lisinopril  HCTZ regimen. - Continue lisinopril  HCTZ - Schedule six-month follow-up for blood pressure monitoring

## 2024-02-25 ENCOUNTER — Ambulatory Visit: Payer: Self-pay | Admitting: Physician Assistant

## 2024-02-25 LAB — HEMOGLOBIN A1C
Est. average glucose Bld gHb Est-mCnc: 103 mg/dL
Hgb A1c MFr Bld: 5.2 % (ref 4.8–5.6)

## 2024-02-25 LAB — COMPREHENSIVE METABOLIC PANEL WITH GFR
ALT: 26 IU/L (ref 0–44)
AST: 21 IU/L (ref 0–40)
Albumin: 4.5 g/dL (ref 3.8–4.9)
Alkaline Phosphatase: 72 IU/L (ref 47–123)
BUN/Creatinine Ratio: 15 (ref 9–20)
BUN: 17 mg/dL (ref 6–24)
Bilirubin Total: 0.8 mg/dL (ref 0.0–1.2)
CO2: 22 mmol/L (ref 20–29)
Calcium: 9.4 mg/dL (ref 8.7–10.2)
Chloride: 99 mmol/L (ref 96–106)
Creatinine, Ser: 1.12 mg/dL (ref 0.76–1.27)
Globulin, Total: 2.5 g/dL (ref 1.5–4.5)
Glucose: 88 mg/dL (ref 70–99)
Potassium: 4 mmol/L (ref 3.5–5.2)
Sodium: 138 mmol/L (ref 134–144)
Total Protein: 7 g/dL (ref 6.0–8.5)
eGFR: 78 mL/min/1.73 (ref >59)

## 2024-02-25 LAB — LIPID PANEL
Chol/HDL Ratio: 4.2 ratio (ref 0.0–5.0)
Cholesterol, Total: 195 mg/dL (ref 100–199)
HDL: 46 mg/dL (ref >39)
LDL Chol Calc (NIH): 132 mg/dL — ABNORMAL HIGH (ref 0–99)
Triglycerides: 96 mg/dL (ref 0–149)
VLDL Cholesterol Cal: 17 mg/dL (ref 5–40)

## 2024-02-25 LAB — PSA TOTAL (REFLEX TO FREE): Prostate Specific Ag, Serum: 0.6 ng/mL (ref 0.0–4.0)

## 2024-02-27 ENCOUNTER — Ambulatory Visit: Payer: Self-pay | Admitting: Family Medicine

## 2024-03-24 ENCOUNTER — Telehealth: Payer: Self-pay

## 2024-03-24 NOTE — Telephone Encounter (Signed)
 Returned phone call to patient to reschedule his 05/11/24 Colonoscopy at [facility].  LVM for him to call me back to reschedule.  Thanks,  [place], CMA

## 2024-03-30 NOTE — Telephone Encounter (Signed)
 Pt LVM on 03/25/24 stating to reschedule to anytime in February.  Call returned.  LVM to let him know his colonoscopy has been rescheduled to 06/19/24 still at [facility].  Instructions updated.  Referral updated.  Thanks,  [place], CMA

## 2024-06-19 ENCOUNTER — Ambulatory Visit: Admit: 2024-06-19 | Admitting: Gastroenterology

## 2024-06-19 SURGERY — COLONOSCOPY
Anesthesia: Choice

## 2024-08-24 ENCOUNTER — Ambulatory Visit: Admitting: Family Medicine
